# Patient Record
Sex: Female | Born: 1985 | Race: Asian | Hispanic: No | Marital: Married | Smoking: Never smoker
Health system: Southern US, Community
[De-identification: ages and names within clinical notes are randomized; demographics above are authoritative.]

## PROBLEM LIST (undated history)

## (undated) DIAGNOSIS — A879 Viral meningitis, unspecified: Secondary | ICD-10-CM

## (undated) DIAGNOSIS — D649 Anemia, unspecified: Secondary | ICD-10-CM

## (undated) DIAGNOSIS — Z87440 Personal history of urinary (tract) infections: Secondary | ICD-10-CM

## (undated) HISTORY — PX: WISDOM TOOTH EXTRACTION: SHX21

## (undated) HISTORY — PX: TOOTH EXTRACTION: SUR596

## (undated) HISTORY — DX: Personal history of urinary (tract) infections: Z87.440

---
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
## 2006-12-25 IMAGING — CR DG CHEST 2V
2 series · 2 of 2 positions shown · non-contrast
Comparison: none

CLINICAL DATA: Positive PPD.
 CHEST - 2 VIEW:
 The heart size and mediastinal contours are within normal limits.  Both lungs are clear.  The visualized skeletal structures are unremarkable.

[view not recorded (1 of 2)]
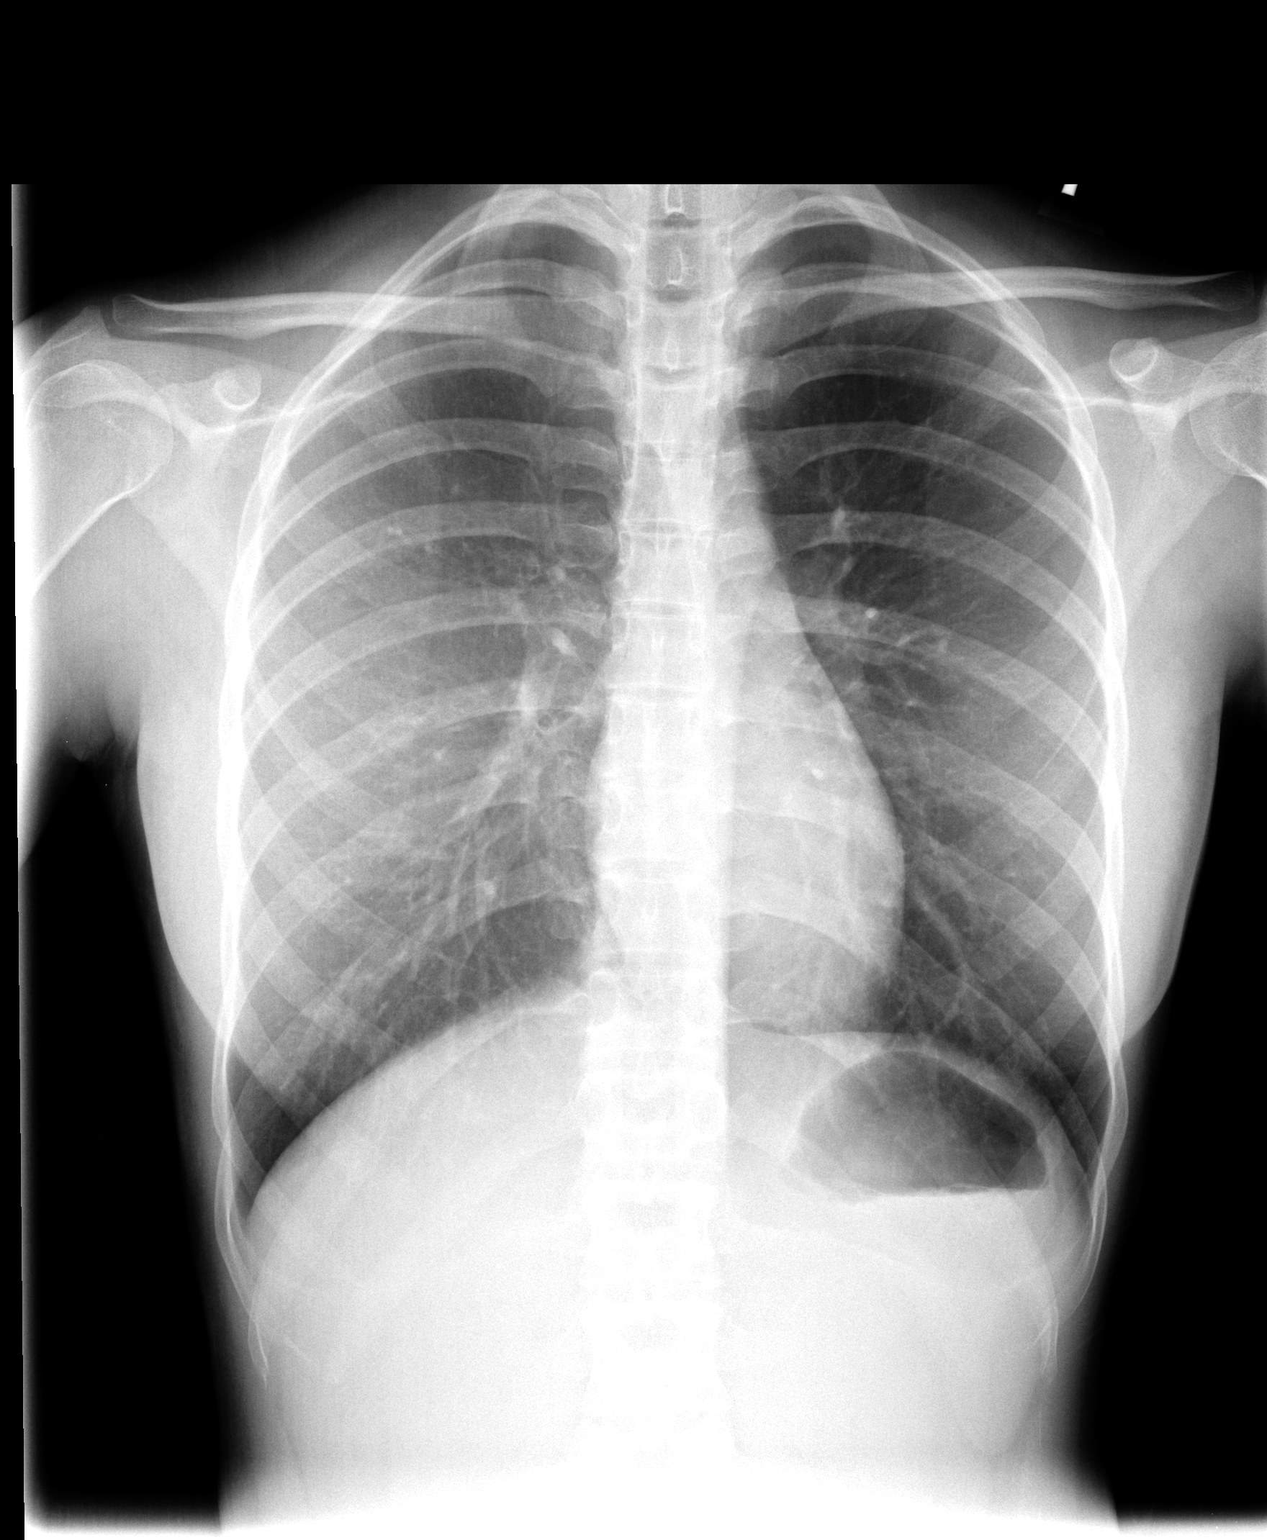

[view not recorded (2 of 2)]
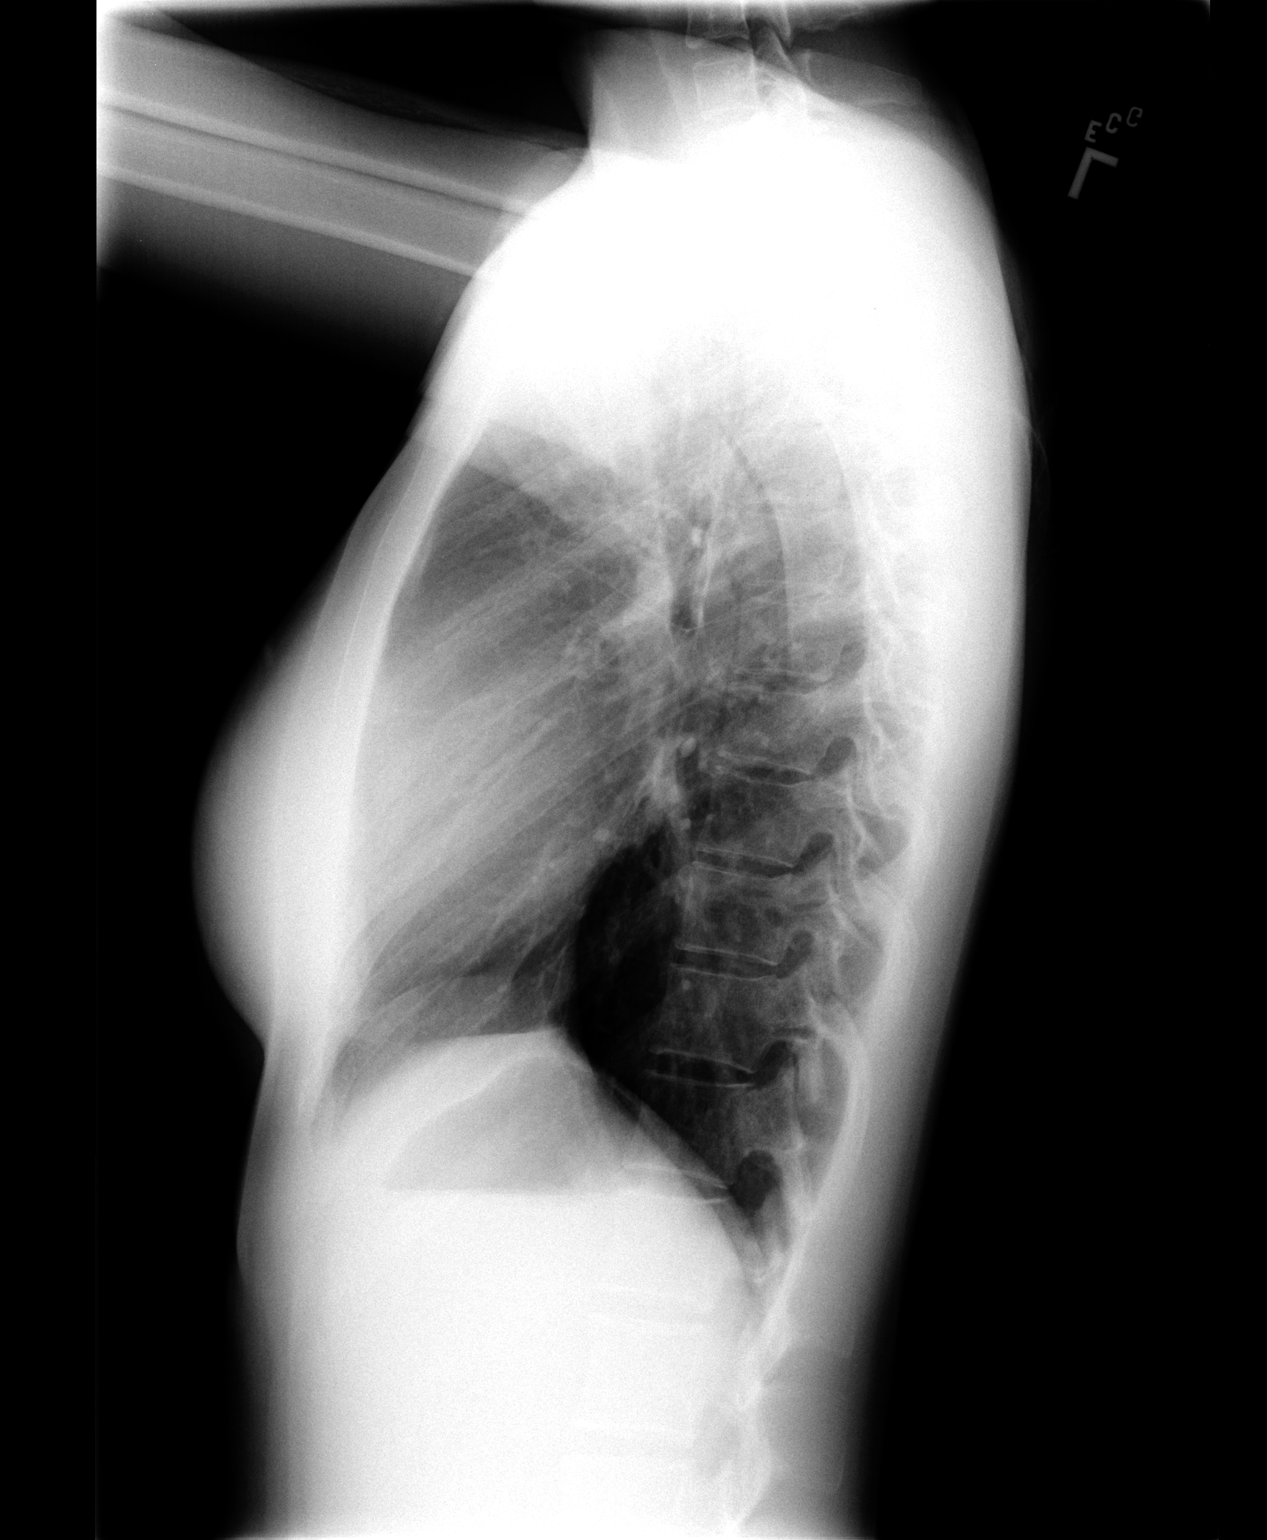

[2 of 2 positions shown; findings below may reference images not displayed]

IMPRESSION: No active cardiopulmonary disease.

## 2010-06-02 NOTE — L&D Delivery Note (Signed)
Delivery Note At 6:32 AM a viable and vigorous female named "[name]"  was delivered via Vaginal, Spontaneous Delivery (Presentation: Left Occiput Anterior, hand).  APGAR: 9, 9; weight 6 lb 13.2 oz (3096 g).  Placed to mother's abd. dryed and stimulated. Bulb suctioned for thin mec secretions. Cord clamped x 2 and cut per FOB. Placenta status: Intact, Spontaneous.  Cord: 3 vessels with the following complications: None.   Anesthesia: 1% Lidocaine for repair  Episiotomy: None Lacerations: 2nd degree;Vaginal;Perineal, 1st degree peri-urethral bilat Suture Repair: 3.0 chromic Est. Blood Loss (mL): 350  Mom to postpartum.  Baby to nursery-stable.  [name] 12/23/2010, 7:20 AM

## 2010-10-22 ENCOUNTER — Inpatient Hospital Stay (HOSPITAL_COMMUNITY)
Admission: AD | Admit: 2010-10-22 | Discharge: 2010-10-25 | DRG: 781 | Disposition: A | Discharge status: Home / Self Care | Payer: Medicaid Other | Source: Ambulatory Visit | Attending: Obstetrics and Gynecology | Admitting: Obstetrics and Gynecology

## 2010-10-22 DIAGNOSIS — B9689 Other specified bacterial agents as the cause of diseases classified elsewhere: Secondary | ICD-10-CM | POA: Diagnosis present

## 2010-10-22 DIAGNOSIS — O99019 Anemia complicating pregnancy, unspecified trimester: Secondary | ICD-10-CM | POA: Diagnosis present

## 2010-10-22 DIAGNOSIS — A499 Bacterial infection, unspecified: Secondary | ICD-10-CM | POA: Diagnosis present

## 2010-10-22 DIAGNOSIS — O2390 Unspecified genitourinary tract infection in pregnancy, unspecified trimester: Secondary | ICD-10-CM | POA: Diagnosis present

## 2010-10-22 DIAGNOSIS — D649 Anemia, unspecified: Secondary | ICD-10-CM | POA: Diagnosis present

## 2010-10-22 DIAGNOSIS — N390 Urinary tract infection, site not specified: Secondary | ICD-10-CM | POA: Diagnosis present

## 2010-10-22 DIAGNOSIS — O4690 Antepartum hemorrhage, unspecified, unspecified trimester: Principal | ICD-10-CM | POA: Diagnosis present

## 2010-10-22 DIAGNOSIS — O26879 Cervical shortening, unspecified trimester: Secondary | ICD-10-CM | POA: Diagnosis present

## 2010-10-22 DIAGNOSIS — O4700 False labor before 37 completed weeks of gestation, unspecified trimester: Secondary | ICD-10-CM | POA: Diagnosis present

## 2010-10-22 DIAGNOSIS — N760 Acute vaginitis: Secondary | ICD-10-CM | POA: Diagnosis present

## 2010-10-22 LAB — URINALYSIS, ROUTINE W REFLEX MICROSCOPIC
Bilirubin Urine: NEGATIVE
Ketones, ur: NEGATIVE mg/dL
Protein, ur: NEGATIVE mg/dL
Specific Gravity, Urine: <1.005 — ABNORMAL LOW (ref 1.005–1.030)

## 2010-10-22 LAB — URINE MICROSCOPIC-ADD ON

## 2010-10-23 ENCOUNTER — Observation Stay (HOSPITAL_COMMUNITY): Payer: Medicaid Other

## 2010-10-23 LAB — CBC
MCHC: 31.9 g/dL (ref 30.0–36.0)
MCV: 79.3 fL (ref 78.0–100.0)
Platelets: 189 10*3/uL (ref 150–400)

## 2010-10-24 ENCOUNTER — Other Ambulatory Visit (HOSPITAL_COMMUNITY): Payer: Medicaid Other

## 2010-10-24 ENCOUNTER — Inpatient Hospital Stay (HOSPITAL_COMMUNITY): Payer: Medicaid Other

## 2010-10-25 LAB — FETAL FIBRONECTIN: Fetal Fibronectin: NEGATIVE

## 2010-10-26 LAB — STREP B DNA PROBE: Strep Group B Ag: NEGATIVE

## 2010-10-26 LAB — URINE CULTURE
Culture  Setup Time: 201205250623
Special Requests: NEGATIVE

## 2010-11-11 NOTE — Discharge Summary (Signed)
[name], [name]                    ACCOUNT NO.:  [id number]  MEDICAL RECORD NO.:  [id number]           PATIENT TYPE:  I  LOCATION:  9156                          FACILITY:  [facility]  PHYSICIAN:  Janine Limbo, M.D.DATE OF BIRTH:  12-Apr-1986  DATE OF ADMISSION:  10/22/2010 DATE OF DISCHARGE:  10/25/2010                              DISCHARGE SUMMARY   ADMITTING DIAGNOSES: 1. 30.3 wk singleton intrauterine pregnancy with vaginal     bleeding. 2. Shortened cervix. 3. Third trimester bleeding. 4. Urinary tract infection.  DISCHARGE DIAGNOSES: 1. 30.3 wk singleton intrauterine pregnancy with vaginal     bleeding. 2. Shortened cervix. 3. Third trimester bleeding. 4. Urinary tract infection.  HOSPITAL PROCEDURES:  Ultrasound, Maternal Fetal Medicine consult. Urinalysis for culture and sensitivity.  GC and Chlamydia cultures.  HOSPITAL COURSE:  Courtney [name] presented to the MAU on 10/22/2010 at around 2145 with complaints of vaginal bleeding on her tissue after wiping and in the urine.  She was placed on EFM, assessed, and found to have uterine contractions approximately every 2-5 minutes that palpated moderate on exam.  On Steriele speculum exam, her cervix was friable and had a small amount of white/yellowish discharge.  Her cervix was closed, thick and high.  Her urinalysis was significant for moderate hemoglobin, moderate leukocytes, 3-6 wbc's and few bacteria.  She was noted to have been treated with Macrobid for a urinary tract infection about a month ago. She was given an IV fluid LR bolus, Procardia 10 mg by mouth, and continued monitoring.    By 09:45  the next morning, she was doing well.  She had received terbutaline twice through the night, which had decreased her uterine contractions to only occasional.  She was placed on Procardia 10 mg p.o. q.6 h. and sceduled to have a complete OB ultrasound ASAP and a repeat CBC. The patient was updated on the plan of care at that time.     Around 11:15 that morning, she had had her scheduled Procardia dose making her feel tired, but was helping with the contractions.  She was having no vaginal bleeding or loss of fluids.  Her vital signs were stable.  Fetal heart rate was reactive at 140s.  She had an occasional moderate to deep variable and contractions were noted about every 4-7 minutes with some uterine irritability.  She had an ultrasound that showed some cervical shortening, had a Maternal Fetal Medicine consult and had a cervical exam by Dr. Su Hilt which was noted to be closed, 80%, 0 station, but very soft.  The group B strep culture was done at that time and plans for a fetal fibronectin the next day after 24 hours, and if that was negative and she had no cervical change, that she could probably be discharged home on Procardia, which were the recommendations made by MFM.  On May 25,2012, she had rested during the night comfortably.  She was having minimal contractions on Procardia 10 mg p.o. q.6 h.  She was afebrile and her her vital signs were stable.  Fetal heart rate was 135, category I, reactive  NST, no decels, having about 1-4 contractions per hour and she was sleeping through.  Plans for a fetal fibronectin were made for 1320 that day and a vaginal exam afterwards.  At 1400, she was still having a reactive NST with an occasional mild spontaneous variable. A Sterile speculum exam was done for obtaining a fetal fibronectin.  The cervix was visually closed with no active bleeding or pooling and the SVE was deferred. Approximately 2 hours later, her fetal fibronectin results did return negative.  Dr. Pennie Rushing saw her, reviewed the plan of care, did a SVE and found her cervix to be closed at 50%, vertex, and not unusually soft for effacement.  With MFM recommendation and since her fetal fibronectin was negative with no cervical change  she was deemed stable to be discharged home.   Courtney [name] has pending group B strep  and urine cultures.  She has had 2 doses status post betamethasone She was deemed stable enough for discharge on bedrest to complete a 7- day course of Flagyl, 7-day course of Macrobid, and to keep a followup appointment on November 06, 2010.  Procardia was p.r.n., greater than 6 contractions per hour.  She is to follow up at [facility] on the 1st of June or if she experiences any problems prior to that day.    ______________________________ [name], [facility], CNM   ______________________________ Janine Limbo, M.D.    DT/MEDQ  D:  10/26/2010  T:  10/27/2010  Job:  [id number]  Electronically Signed by [name] CNM on 10/31/2010 04:36:01 PM Electronically Signed by Kirkland Hun M.D. on 11/11/2010 03:47:44 PM

## 2010-11-25 ENCOUNTER — Inpatient Hospital Stay (HOSPITAL_COMMUNITY)
Admission: AD | Admit: 2010-11-25 | Discharge: 2010-11-25 | Disposition: A | Discharge status: Home / Self Care | Payer: Medicaid Other | Source: Ambulatory Visit | Attending: Obstetrics and Gynecology | Admitting: Obstetrics and Gynecology

## 2010-11-25 DIAGNOSIS — O4700 False labor before 37 completed weeks of gestation, unspecified trimester: Secondary | ICD-10-CM | POA: Insufficient documentation

## 2010-11-25 LAB — URINALYSIS, ROUTINE W REFLEX MICROSCOPIC
Glucose, UA: NEGATIVE mg/dL
Hgb urine dipstick: NEGATIVE
Nitrite: NEGATIVE
Specific Gravity, Urine: 1.02 (ref 1.005–1.030)
Urobilinogen, UA: 0.2 mg/dL (ref 0.0–1.0)
pH: 7.5 (ref 5.0–8.0)

## 2010-11-25 LAB — URINE MICROSCOPIC-ADD ON

## 2010-12-22 ENCOUNTER — Inpatient Hospital Stay (HOSPITAL_COMMUNITY)
Admission: AD | Admit: 2010-12-22 | Discharge: 2010-12-23 | Disposition: A | Discharge status: Home / Self Care | Payer: Medicaid Other | Source: Ambulatory Visit | Attending: Obstetrics and Gynecology | Admitting: Obstetrics and Gynecology

## 2010-12-22 ENCOUNTER — Encounter (HOSPITAL_COMMUNITY): Payer: Self-pay | Admitting: *Deleted

## 2010-12-22 DIAGNOSIS — O99891 Other specified diseases and conditions complicating pregnancy: Secondary | ICD-10-CM | POA: Insufficient documentation

## 2010-12-22 DIAGNOSIS — O479 False labor, unspecified: Secondary | ICD-10-CM

## 2010-12-22 DIAGNOSIS — N898 Other specified noninflammatory disorders of vagina: Secondary | ICD-10-CM | POA: Insufficient documentation

## 2010-12-22 HISTORY — DX: Viral meningitis, unspecified: A87.9

## 2010-12-22 HISTORY — DX: Anemia, unspecified: D64.9

## 2010-12-22 NOTE — Progress Notes (Signed)
Pt states she had mucus dc with blood tinge.  Ctx since 1430.  G1, 39wks

## 2010-12-22 NOTE — Progress Notes (Signed)
Pt says her water broke @ 2218

## 2010-12-23 ENCOUNTER — Inpatient Hospital Stay (HOSPITAL_COMMUNITY)
Admission: AD | Admit: 2010-12-23 | Discharge: 2010-12-25 | DRG: 775 | Disposition: A | Discharge status: Home / Self Care | Payer: Medicaid Other | Source: Ambulatory Visit | Attending: Obstetrics and Gynecology | Admitting: Obstetrics and Gynecology

## 2010-12-23 ENCOUNTER — Encounter (HOSPITAL_COMMUNITY): Payer: Self-pay | Admitting: *Deleted

## 2010-12-23 DIAGNOSIS — Z331 Pregnant state, incidental: Secondary | ICD-10-CM

## 2010-12-23 MED ORDER — LIDOCAINE HCL (PF) 1 % IJ SOLN: 30.0000 mL | INTRAMUSCULAR | Status: DC | PRN

## 2010-12-23 MED ORDER — LACTATED RINGERS IV SOLN: INTRAVENOUS | Status: DC

## 2010-12-23 MED ORDER — CITRIC ACID-SODIUM CITRATE 334-500 MG/5ML PO SOLN: 30.0000 mL | ORAL | Status: DC | PRN

## 2010-12-23 MED ORDER — OXYTOCIN 10 UNIT/ML IJ SOLN
INTRAMUSCULAR | Status: AC
  Administered 2010-12-23: 20 [IU] via INTRAMUSCULAR
  Filled 2010-12-23: qty 2

## 2010-12-23 MED ORDER — OXYCODONE-ACETAMINOPHEN 5-325 MG PO TABS: 2.0000 | ORAL_TABLET | ORAL | Status: DC | PRN

## 2010-12-23 MED ORDER — LIDOCAINE HCL (PF) 1 % IJ SOLN
INTRAMUSCULAR | Status: AC
  Administered 2010-12-23: 30 mL
  Filled 2010-12-23: qty 30

## 2010-12-23 MED ORDER — ACETAMINOPHEN 325 MG PO TABS: 650.0000 mg | ORAL_TABLET | ORAL | Status: DC | PRN

## 2010-12-23 MED ORDER — IBUPROFEN 600 MG PO TABS
600.0000 mg | ORAL_TABLET | Freq: Four times a day (QID) | ORAL | Status: DC
  Administered 2010-12-23 – 2010-12-25 (×9): 600 mg via ORAL
  Filled 2010-12-23 (×9): qty 1

## 2010-12-23 MED ORDER — ONDANSETRON HCL 4 MG/2ML IJ SOLN: 4.0000 mg | Freq: Four times a day (QID) | INTRAMUSCULAR | Status: DC | PRN

## 2010-12-23 MED ORDER — FLEET ENEMA 7-19 GM/118ML RE ENEM: 1.0000 | ENEMA | RECTAL | Status: DC | PRN

## 2010-12-23 MED ORDER — IBUPROFEN 600 MG PO TABS: 600.0000 mg | ORAL_TABLET | Freq: Four times a day (QID) | ORAL | Status: DC | PRN

## 2010-12-23 NOTE — H&P (Signed)
Courtney [name] is a 25 y.o. female presenting for Active Labor with urge to push once arriving onto Labor and Delivery. Maternal Medical History:  Reason for admission: Reason for admission: contractions.  Contractions: Onset was 1-2 hours ago.   Frequency: regular.   Perceived severity is strong.    Fetal activity: Perceived fetal activity is normal.   Last perceived fetal movement was within the past hour.    Prenatal complications: no prenatal complications No bleeding, cholelithiasis, HIV, hypertension, infection, IUGR, nephrolithiasis, oligohydramnios, placental abnormality, polyhydramnios, pre-eclampsia, preterm labor, substance abuse, thrombocytopenia or thrombophilia.     OB History    Grav Para Term Preterm Abortions TAB SAB Ect Mult Living   1 1 1       1      Past Medical History  Diagnosis Date  . Viral meningitis   . Anemia    Past Surgical History  Procedure Date  . Tooth extraction   . Wisdom tooth extraction    Family History: family history includes Thyroid cancer in her mother. Social History:  reports that she has never smoked. She does not have any smokeless tobacco history on file. She reports that she does not drink alcohol or use illicit drugs.  Review of Systems  Constitutional: Negative.   HENT: Negative.   Eyes: Negative.   Respiratory: Negative.   Cardiovascular: Negative.   Gastrointestinal: Negative.   Genitourinary: Negative.   Musculoskeletal: Negative.   Skin: Negative.   Neurological: Negative.   Endo/Heme/Allergies: Negative.   Psychiatric/Behavioral: Negative.     Dilation: 10 Effacement (%): 100 Station: +2 Exam by:: D.Vicie Cech, CNM  Blood pressure 105/68, pulse 67, temperature 98.6 F (37 C), temperature source Oral, resp. rate 16, height 5\' 2"  (1.575 m), weight 53.978 kg (119 lb), SpO2 97.00%, unknown if currently breastfeeding. Maternal Exam:  Uterine Assessment: Contraction strength is firm.  Contraction frequency is regular.    Abdomen: Patient reports no abdominal tenderness. Fundal height is aga.   Fetal presentation: vertex  Introitus: Normal vulva. Normal vagina.  Pelvis: adequate for delivery.   Cervix: Cervix evaluated by digital exam.     Fetal Exam Fetal Monitor Review: Mode: ultrasound.   Baseline rate: 120.  Variability: moderate (6-25 bpm).   Pattern: accelerations present and variable decelerations.    Fetal State Assessment: Category II - tracings are indeterminate.     Physical Exam  Constitutional: She is oriented to person, place, and time. She appears well-developed and well-nourished. She appears distressed (with UC's).  Cardiovascular: Normal rate.   Respiratory: Effort normal.  GI: Soft.  Genitourinary: Vagina normal and uterus normal.  Musculoskeletal: Normal range of motion.  Neurological: She is alert and oriented to person, place, and time.  Skin: Skin is warm and dry.  Psychiatric: She has a normal mood and affect. Her behavior is normal. Judgment and thought content normal.   SVE C/C/+1 st with BBOW, Arom lt Mec stained AF, prepared for del.  Prenatal labs: ABO, Rh:   Antibody:   Rubella:   RPR:    HBsAg:    HIV:    GBS: NEGATIVE (05/24 1304)   Assessment/Plan: 1> Term SIUP Active Labor @ 10 cms and urge to push Plan: Admit Anticipate SVD Dr. Stefano Gaul made aware after delivery completed   [name] 12/23/2010, 1:18 PM

## 2010-12-23 NOTE — ED Provider Notes (Signed)
Courtney [name] is a 25 y.o. female presenting for evaluation of SROM and UC's.  States had "gush of fluid @ home, but none since" was yellowish in color and slight blood present. Also UC's but not progressively worse than has had all week. Was evaluated in office on WEd and was 3 cms.  History OB History    Grav Para Term Preterm Abortions TAB SAB Ect Mult Living   1              Past Medical History  Diagnosis Date  . Viral meningitis   . Anemia    Past Surgical History  Procedure Date  . Tooth extraction    Family History: family history includes Thyroid cancer in her mother. Social History:  reports that she has never smoked. She does not have any smokeless tobacco history on file. She reports that she does not drink alcohol or use illicit drugs.  Review of Systems  Constitutional: Negative.   HENT: Negative.   Eyes: Negative.   Respiratory: Negative.   Cardiovascular: Negative.   Gastrointestinal: Negative.   Genitourinary: Negative.   Musculoskeletal: Negative.   Skin: Negative.   Endo/Heme/Allergies: Negative.   Psychiatric/Behavioral: Negative.     Dilation: 3.5 Effacement (%): 90 Station: -1 Exam by:: D. Ladona Ridgel CNM Blood pressure 120/84, pulse 85, temperature 98.5 F (36.9 C), temperature source Oral, resp. rate 18, height 5\' 2"  (1.575 m), weight 54.159 kg (119 lb 6.4 oz). Maternal Exam:  Uterine Assessment: Contraction strength is mild.  Contraction frequency is irregular.   Abdomen: Patient reports no abdominal tenderness. Fundal height is AGA.   Fetal presentation: vertex  Introitus: Normal vulva. Vagina is positive for vaginal discharge (mucousy with scant blood).  Ferning test: negative.   Pelvis: adequate for delivery.   Cervix: Cervix evaluated by sterile speculum exam and digital exam.     Fetal Exam Fetal Monitor Review: Mode: ultrasound.   Baseline rate: 135.  Variability: moderate (6-25 bpm).   Pattern: accelerations present and no decelerations.      Fetal State Assessment: Category I - tracings are normal.     Physical Exam  Constitutional: She is oriented to person, place, and time. She appears well-developed and well-nourished.  HENT:  Head: Normocephalic.  Respiratory: Effort normal.  Genitourinary: Vaginal discharge (mucousy with scant blood) found.  Musculoskeletal: Normal range of motion. She exhibits no edema.  Neurological: She is alert and oriented to person, place, and time.  Skin: Skin is warm and dry.  Psychiatric: She has a normal mood and affect. Her behavior is normal. Judgment and thought content normal.   Amnisure neg SVE 3-4/90/-1 vtx with palpable BOW, no cervical change after 1.5 hours (1 hour of ambulation) Prenatal labs: ABO, Rh:   Antibody:   Rubella:   RPR:    HBsAg:    HIV:    GBS: NEGATIVE (05/24 1304)   Assessment/Plan: 1. R/O ROM 2. False Labor Plan: Reviewed pt status with Dr. Stefano Gaul 1. Discharge home with labor warnings 2. Keep appt. In office for Wednesday   [name] 12/23/2010, 12:43 AM

## 2010-12-23 NOTE — Plan of Care (Signed)
Problem: Consults Goal: Birthing Suites Patient Information Press F2 to bring up selections list  Outcome: Completed/Met Date Met:  12/23/10  Pt 37-[redacted] weeks EGA

## 2010-12-23 NOTE — Progress Notes (Signed)
Pt returned from ambulating

## 2010-12-24 LAB — CBC
Hemoglobin: 10.4 g/dL — ABNORMAL LOW (ref 12.0–15.0)
MCH: 26 pg (ref 26.0–34.0)
MCV: 78.3 fL (ref 78.0–100.0)
Platelets: 174 10*3/uL (ref 150–400)
RBC: 4 MIL/uL (ref 3.87–5.11)
WBC: 13.6 10*3/uL — ABNORMAL HIGH (ref 4.0–10.5)

## 2010-12-24 MED ORDER — SIMETHICONE 80 MG PO CHEW: 80.0000 mg | CHEWABLE_TABLET | ORAL | Status: DC | PRN

## 2010-12-24 MED ORDER — WITCH HAZEL-GLYCERIN EX PADS: MEDICATED_PAD | CUTANEOUS | Status: DC | PRN

## 2010-12-24 MED ORDER — DIPHENHYDRAMINE HCL 25 MG PO CAPS: 25.0000 mg | ORAL_CAPSULE | Freq: Four times a day (QID) | ORAL | Status: DC | PRN

## 2010-12-24 MED ORDER — OXYCODONE-ACETAMINOPHEN 5-325 MG PO TABS: 1.0000 | ORAL_TABLET | ORAL | Status: DC | PRN

## 2010-12-24 MED ORDER — TETANUS-DIPHTH-ACELL PERTUSSIS 5-2.5-18.5 LF-MCG/0.5 IM SUSP: 0.5000 mL | Freq: Once | INTRAMUSCULAR | Status: DC

## 2010-12-24 MED ORDER — ZOLPIDEM TARTRATE 5 MG PO TABS: 5.0000 mg | ORAL_TABLET | Freq: Every evening | ORAL | Status: DC | PRN

## 2010-12-24 MED ORDER — BENZOCAINE-MENTHOL 20-0.5 % EX AERO
INHALATION_SPRAY | CUTANEOUS | Status: AC
  Administered 2010-12-24: 1 via TOPICAL
  Filled 2010-12-24: qty 56

## 2010-12-24 MED ORDER — SENNOSIDES-DOCUSATE SODIUM 8.6-50 MG PO TABS
1.0000 | ORAL_TABLET | Freq: Every day | ORAL | Status: DC
  Administered 2010-12-25: 1 via ORAL

## 2010-12-24 MED ORDER — BENZOCAINE-MENTHOL 20-0.5 % EX AERO
1.0000 "application " | INHALATION_SPRAY | CUTANEOUS | Status: DC | PRN
  Administered 2010-12-24: 1 via TOPICAL

## 2010-12-24 MED ORDER — PRENATAL PLUS 27-1 MG PO TABS
1.0000 | ORAL_TABLET | Freq: Every day | ORAL | Status: DC
  Administered 2010-12-24 – 2010-12-25 (×2): 1 via ORAL
  Filled 2010-12-24 (×2): qty 1

## 2010-12-24 MED ORDER — ONDANSETRON HCL 4 MG PO TABS: 4.0000 mg | ORAL_TABLET | ORAL | Status: DC | PRN

## 2010-12-24 MED ORDER — LANOLIN HYDROUS EX OINT: TOPICAL_OINTMENT | CUTANEOUS | Status: DC | PRN

## 2010-12-24 MED ORDER — ONDANSETRON HCL 4 MG/2ML IJ SOLN: 4.0000 mg | INTRAMUSCULAR | Status: DC | PRN

## 2010-12-24 NOTE — Progress Notes (Signed)
Post Partum Day 1 Subjective: up ad lib, voiding, tolerating PO, + flatus and very sleepy.  BF'ng going well.  VB lightening.  s.o. at bs & supportive  Objective: Blood pressure 102/68, pulse 75, temperature 98.1 F (36.7 C), temperature source Oral, resp. rate 18, height 5\' 2"  (1.575 m), weight 53.978 kg (119 lb), SpO2 97.00%, unknown if currently breastfeeding.  Physical Exam:  General: alert, cooperative, no distress and drowsy Lochia: appropriate Uterine Fundus: firm; u/-1 Incision: n/a DVT Evaluation: No evidence of DVT seen on physical exam. Negative Homan's sign. No significant calf/ankle edema.  No results found for this basename: HGB:2,HCT:2 in the last 72 hours  Assessment/Plan: Plan for discharge tomorrow, Breastfeeding and Lactation consult.  CBC ordered.   LOS: 1 day   Halina Asano H 12/24/2010, 9:16 AM

## 2010-12-24 NOTE — Progress Notes (Signed)
UR Chart review completed.  

## 2010-12-24 NOTE — Progress Notes (Signed)
Infant cluster fed in night and mom states about 6am infant began latching well and could hear swallowing. Reviewed lots about pumping , mom states may go back to school , insterested in pumping and bottle feeding later.

## 2010-12-24 NOTE — Progress Notes (Signed)
Basic teaching done with parents, Infant latched well and 20 min feeding observed. Answered lots of questions about basics.

## 2010-12-25 DIAGNOSIS — Z331 Pregnant state, incidental: Secondary | ICD-10-CM

## 2010-12-25 MED ORDER — OXYCODONE-ACETAMINOPHEN 5-325 MG PO TABS: 1.0000 | ORAL_TABLET | Freq: Four times a day (QID) | ORAL | Status: AC | PRN

## 2010-12-25 MED ORDER — IBUPROFEN 600 MG PO TABS: 600.0000 mg | ORAL_TABLET | Freq: Four times a day (QID) | ORAL | Status: AC

## 2010-12-25 NOTE — Discharge Summary (Signed)
Obstetric Discharge Summary Reason for Admission: onset of labor Prenatal Procedures: ultrasound Intrapartum Procedures: spontaneous vaginal delivery Postpartum Procedures: none Complications-Operative and Postpartum: 2nd degree perineal laceration  Hemoglobin  Date Value Range Status  12/24/2010 10.4* 12.0-15.0 (g/dL) Final     HCT  Date Value Range Status  12/24/2010 31.3* 36.0-46.0 (%) Final    Discharge Diagnoses: Term Pregnancy-delivered  Discharge Information: Date: 12/25/2010 Activity: unrestricted Diet: routine Medications: Ibuprophen and Percocet Condition: stable Instructions: refer to practice specific booklet Discharge to: home Follow-up Information    Follow up with [name], CNM. Make an appointment in 4 weeks. (Call for an appt for 4 weeks.)    Contact information:   [address] [facility] [place] [address] [phone]          Newborn Data: Live born  Information for the patient's newborn:  Ida, Girl Imani [[id number]]  female ; APGAR APGAR (1 MIN): 9   APGAR (5 MINS): 9   APGAR (10 MINS):   , ; weight ; 6# 13.2 Home with mother.  Adeola Dennen O. 12/25/2010, 11:09 AM

## 2010-12-25 NOTE — Progress Notes (Signed)
Post Partum Day 2 Subjective: no complaints, up ad lib, voiding, tolerating PO and + flatus.  Working on breastfeeding.  Minimal perineal pain and well controlled with medications  Objective: Blood pressure 108/61, pulse 90, temperature 98 F (36.7 C), temperature source Oral, resp. rate 18, height 5\' 2"  (1.575 m), weight 53.978 kg (119 lb), SpO2 97.00%, unknown if currently breastfeeding. Filed Vitals:   12/24/10 0536 12/24/10 1350 12/24/10 2149 12/25/10 0514  BP: 102/68 115/69 96/62 108/61  Pulse: 75 74 80 90  Temp: 98.1 F (36.7 C) 98.4 F (36.9 C) 98.2 F (36.8 C) 98 F (36.7 C)  TempSrc: Oral Oral Oral Oral  Resp: 18 18 18 18   Height:      Weight:      SpO2:       Physical Exam:  General: alert, cooperative and no distress. Skin  W&D, color satis.   Heart:  RRR Lungs:  Clear to ausculation bilat Abd:  Soft/Nontender.  Pos BS x 4 quads Lochia: appropriate Uterine Fundus: firm, nontender 2 below umbilicus  Incision: N/A Perineum:  Healing/Intact DVT Evaluation: No evidence of DVT seen on physical exam.  Neg Homan's bilat. Extrems:  Neg edema.  DTRs 1+, no clonus.   Basename 12/24/10 1015  HGB 10.4*  HCT 31.3*    Assessment/Plan: Stable s/p vag delivery at 39w 2d.  Discharge to home.  D/C instructions reviewed.  Pt plans Mirena for contraception.     LOS: 2 days   Joda Braatz O. 12/25/2010, 11:00 AM

## 2010-12-30 NOTE — H&P (Signed)
Reviewed

## 2011-06-06 LAB — ABO/RH: ABO/RH(D): O POS

## 2011-12-19 ENCOUNTER — Encounter: Payer: Self-pay | Admitting: Obstetrics and Gynecology

## 2012-02-11 ENCOUNTER — Encounter: Payer: Self-pay | Admitting: Obstetrics and Gynecology

## 2012-02-11 ENCOUNTER — Ambulatory Visit (INDEPENDENT_AMBULATORY_CARE_PROVIDER_SITE_OTHER): Payer: BC Managed Care – PPO | Admitting: Obstetrics and Gynecology

## 2012-02-11 VITALS — BP 100/60 | HR 68 | Resp 16 | Ht 62.0 in | Wt 93.0 lb

## 2012-02-11 DIAGNOSIS — IMO0002 Reserved for concepts with insufficient information to code with codable children: Secondary | ICD-10-CM

## 2012-02-11 DIAGNOSIS — Z0000 Encounter for general adult medical examination without abnormal findings: Secondary | ICD-10-CM

## 2012-02-11 DIAGNOSIS — Z124 Encounter for screening for malignant neoplasm of cervix: Secondary | ICD-10-CM

## 2012-02-11 NOTE — Progress Notes (Signed)
Contraception None Last pap 08/07/2010 WNL Last Mammo None Last Colonoscopy None Last Dexa Scan None Primary MD None Abuse at Home None  No complaints.  Wants Mirena (SE Reviewed and discussed). Filed Vitals:   02/11/12 1514  BP: 100/60  Pulse: 68  Resp: 16   ROS: noncontributory  Physical Examination: General appearance - alert, well appearing, and in no distress Neck - supple, no significant adenopathy Chest - clear to auscultation, no wheezes, rales or rhonchi, symmetric air entry Heart - normal rate and regular rhythm Abdomen - soft, nontender, nondistended, no masses or organomegaly Breasts - breasts appear normal, no suspicious masses, no skin or nipple changes or axillary nodes Pelvic - normal external genitalia, vulva, vagina, cervix, uterus and adnexa Back exam - no CVAT Extremities - no edema, redness or tenderness in the calves or thighs  A/P With menses for Mirena GC/CT today with consent pre IUD AEX in 72yr

## 2012-02-13 LAB — PAP IG, CT-NG, RFX HPV ASCU

## 2012-02-23 ENCOUNTER — Encounter: Payer: Self-pay | Admitting: Obstetrics and Gynecology

## 2012-02-23 ENCOUNTER — Ambulatory Visit (INDEPENDENT_AMBULATORY_CARE_PROVIDER_SITE_OTHER): Payer: BC Managed Care – PPO | Admitting: Obstetrics and Gynecology

## 2012-02-23 VITALS — BP 100/60 | Ht 63.0 in | Wt 95.0 lb

## 2012-02-23 DIAGNOSIS — Z139 Encounter for screening, unspecified: Secondary | ICD-10-CM

## 2012-02-23 DIAGNOSIS — Z975 Presence of (intrauterine) contraceptive device: Secondary | ICD-10-CM

## 2012-02-23 DIAGNOSIS — Z30430 Encounter for insertion of intrauterine contraceptive device: Secondary | ICD-10-CM

## 2012-02-23 LAB — POCT URINE PREGNANCY: Preg Test, Ur: NEGATIVE

## 2012-02-23 MED ORDER — LEVONORGESTREL 20 MCG/24HR IU IUD
INTRAUTERINE_SYSTEM | Freq: Once | INTRAUTERINE | Status: AC
  Administered 2012-02-23: 1 via INTRAUTERINE

## 2012-02-23 NOTE — Progress Notes (Signed)
ZOX:WRUEAV LOT#: [id number] Exp: 06/2014 UPT: negative GC/CHLAMYDIA: negative CONSENT SIGNED: yes DISINFECTION WITH betadine X3 UTERUS SOUNDED AT 7 CM IUD INSERTED PER PROTOCOL: yes COMPLICATION: none PATIENT INSTRUCTED TO CALL IS FEVER OR ABNORMAL PAIN:yes PATIENT INSTRUCTED ON HOW TO CHECK IUD STRINGS: yes FOLLOW UP APPT: 5wks

## 2012-03-31 ENCOUNTER — Encounter: Payer: Self-pay | Admitting: Obstetrics and Gynecology

## 2012-03-31 ENCOUNTER — Ambulatory Visit (INDEPENDENT_AMBULATORY_CARE_PROVIDER_SITE_OTHER): Payer: BC Managed Care – PPO | Admitting: Obstetrics and Gynecology

## 2012-03-31 VITALS — BP 90/60 | Ht 62.0 in | Wt 92.0 lb

## 2012-03-31 DIAGNOSIS — Z30431 Encounter for routine checking of intrauterine contraceptive device: Secondary | ICD-10-CM

## 2012-03-31 NOTE — Progress Notes (Signed)
Here to f/u IUD insertion No complaints  Filed Vitals:   03/31/12 1518  BP: 90/60   ROS: noncontributory  Pelvic exam:  VULVA: normal appearing vulva with no masses, tenderness or lesions,  VAGINA: normal appearing vagina with normal color and discharge, no lesions, CERVIX: normal appearing cervix without discharge or lesions, string is visible UTERUS: uterus is normal size, shape, consistency and nontender,  ADNEXA: normal adnexa in size, nontender and no masses.  A/P AEX in September IUD in place

## 2013-04-29 ENCOUNTER — Encounter (HOSPITAL_COMMUNITY): Payer: Self-pay | Admitting: Emergency Medicine

## 2013-04-29 ENCOUNTER — Emergency Department (HOSPITAL_COMMUNITY)
Admission: EM | Admit: 2013-04-29 | Discharge: 2013-04-29 | Disposition: A | Discharge status: Home / Self Care | Payer: Self-pay | Attending: Emergency Medicine | Admitting: Emergency Medicine

## 2013-04-29 DIAGNOSIS — N12 Tubulo-interstitial nephritis, not specified as acute or chronic: Secondary | ICD-10-CM | POA: Insufficient documentation

## 2013-04-29 DIAGNOSIS — Z8661 Personal history of infections of the central nervous system: Secondary | ICD-10-CM | POA: Insufficient documentation

## 2013-04-29 DIAGNOSIS — Z87440 Personal history of urinary (tract) infections: Secondary | ICD-10-CM | POA: Insufficient documentation

## 2013-04-29 DIAGNOSIS — Z3202 Encounter for pregnancy test, result negative: Secondary | ICD-10-CM | POA: Insufficient documentation

## 2013-04-29 DIAGNOSIS — D649 Anemia, unspecified: Secondary | ICD-10-CM | POA: Insufficient documentation

## 2013-04-29 LAB — COMPREHENSIVE METABOLIC PANEL
Albumin: 4.5 g/dL (ref 3.5–5.2)
Alkaline Phosphatase: 44 U/L (ref 39–117)
BUN: 14 mg/dL (ref 6–23)
Calcium: 9.4 mg/dL (ref 8.4–10.5)
Creatinine, Ser: 0.7 mg/dL (ref 0.50–1.10)
GFR calc Af Amer: >90 mL/min (ref >90)
Glucose, Bld: 95 mg/dL (ref 70–99)
Total Protein: 7.8 g/dL (ref 6.0–8.3)

## 2013-04-29 LAB — URINALYSIS, ROUTINE W REFLEX MICROSCOPIC
Glucose, UA: NEGATIVE mg/dL
Ketones, ur: 15 mg/dL — AB
Nitrite: POSITIVE — AB
Specific Gravity, Urine: 1.016 (ref 1.005–1.030)
pH: 6 (ref 5.0–8.0)

## 2013-04-29 LAB — CBC WITH DIFFERENTIAL/PLATELET
Basophils Absolute: 0 10*3/uL (ref 0.0–0.1)
Basophils Relative: 0 % (ref 0–1)
Eosinophils Absolute: 0.2 10*3/uL (ref 0.0–0.7)
HCT: 35.8 % — ABNORMAL LOW (ref 36.0–46.0)
Hemoglobin: 12.1 g/dL (ref 12.0–15.0)
Lymphocytes Relative: 21 % (ref 12–46)
Lymphs Abs: 1.8 10*3/uL (ref 0.7–4.0)
MCH: 25.2 pg — ABNORMAL LOW (ref 26.0–34.0)
MCHC: 33.8 g/dL (ref 30.0–36.0)
Monocytes Absolute: 0.6 10*3/uL (ref 0.1–1.0)
Neutro Abs: 6.2 10*3/uL (ref 1.7–7.7)
RDW: 12.7 % (ref 11.5–15.5)

## 2013-04-29 LAB — URINE MICROSCOPIC-ADD ON

## 2013-04-29 LAB — LIPASE, BLOOD: Lipase: 16 U/L (ref 11–59)

## 2013-04-29 MED ORDER — CIPROFLOXACIN HCL 500 MG PO TABS: 500.0000 mg | ORAL_TABLET | Freq: Two times a day (BID) | ORAL | Status: DC

## 2013-04-29 MED ORDER — MORPHINE SULFATE 4 MG/ML IJ SOLN
4.0000 mg | Freq: Once | INTRAMUSCULAR | Status: AC
  Administered 2013-04-29: 4 mg via INTRAVENOUS
  Filled 2013-04-29: qty 1

## 2013-04-29 MED ORDER — HYDROCODONE-ACETAMINOPHEN 5-325 MG PO TABS: 1.0000 | ORAL_TABLET | ORAL | Status: DC | PRN

## 2013-04-29 MED ORDER — ONDANSETRON HCL 4 MG/2ML IJ SOLN
4.0000 mg | Freq: Once | INTRAMUSCULAR | Status: AC
  Administered 2013-04-29: 4 mg via INTRAVENOUS
  Filled 2013-04-29: qty 2

## 2013-04-29 MED ORDER — ONDANSETRON HCL 4 MG PO TABS: 4.0000 mg | ORAL_TABLET | Freq: Four times a day (QID) | ORAL | Status: DC

## 2013-04-29 MED ORDER — SODIUM CHLORIDE 0.9 % IV BOLUS (SEPSIS)
1000.0000 mL | Freq: Once | INTRAVENOUS | Status: AC
  Administered 2013-04-29: 1000 mL via INTRAVENOUS

## 2013-04-29 MED ORDER — DEXTROSE 5 % IV SOLN
1.0000 g | Freq: Once | INTRAVENOUS | Status: AC
  Administered 2013-04-29: 1 g via INTRAVENOUS
  Filled 2013-04-29: qty 10

## 2013-04-29 NOTE — ED Provider Notes (Signed)
Medical screening examination/treatment/procedure(s) were performed by non-physician practitioner and as supervising physician I was immediately available for consultation/collaboration.  EKG Interpretation   None         Elsi Stelzer N Marrianne Sica, DO 04/29/13 1720 

## 2013-04-29 NOTE — ED Notes (Signed)
Pt c/o right sided flank and abd pain x 3 days becoming more severe; pt tearful

## 2013-04-29 NOTE — ED Provider Notes (Signed)
CSN: [id number]     Arrival date & time 04/29/13  1503 History   First MD Initiated Contact with Patient 04/29/13 1538     Chief Complaint  Patient presents with  . Abdominal Pain  . Flank Pain   (Consider location/radiation/quality/duration/timing/severity/associated sxs/prior Treatment) Patient is a 27 y.o. female presenting with abdominal pain and flank pain. The history is provided by the patient.  Abdominal Pain Pain location:  RLQ and RUQ Pain quality: fullness and sharp   Pain radiates to:  Back Pain severity:  Severe Onset quality:  Gradual Duration:  3 days Timing:  Intermittent Progression:  Worsening Chronicity:  New Context: not alcohol use, not diet changes, not eating, not medication withdrawal, not recent travel, not retching and not trauma   Relieved by:  Nothing Worsened by:  Nothing tried Ineffective treatments:  None tried Associated symptoms: no anorexia, no constipation, no diarrhea, no dysuria, no fatigue, no fever, no flatus, no sore throat, no vaginal discharge and no vomiting   Risk factors: not pregnant and no recent hospitalization   Flank Pain Associated symptoms include abdominal pain. Pertinent negatives include no anorexia, fatigue, fever, sore throat or vomiting.    Courtney [name] is a 27 y.o.female without any significant PMH presents to the ER with complaints of right sided abdominal pain for three days that has been progressively getting worse. Denies ever having pain like this before. No nausea or vomiting. No bowel movements.      Past Medical History  Diagnosis Date  . Viral meningitis   . Anemia   . History of recurrent UTI (urinary tract infection)    Past Surgical History  Procedure Laterality Date  . Tooth extraction    . Wisdom tooth extraction     Family History  Problem Relation Age of Onset  . Thyroid cancer Mother   . Thyroid nodules Mother   . Thyroid nodules Sister    History  Substance Use Topics  . Smoking status:  Never Smoker   . Smokeless tobacco: Never Used  . Alcohol Use: No   OB History   Grav Para Term Preterm Abortions TAB SAB Ect Mult Living   2 1 1  1  1   1      Review of Systems  Constitutional: Negative for fever and fatigue.  HENT: Negative for sore throat.   Gastrointestinal: Positive for abdominal pain. Negative for vomiting, diarrhea, constipation, anorexia and flatus.  Genitourinary: Positive for flank pain. Negative for dysuria and vaginal discharge.    Allergies  Review of patient's allergies indicates no known allergies.  Home Medications   Current Outpatient Rx  Name  Route  Sig  Dispense  Refill  . ibuprofen (ADVIL,MOTRIN) 200 MG tablet   Oral   Take 200 mg by mouth every 6 (six) hours as needed for moderate pain.         Marland Kitchen levonorgestrel (MIRENA) 20 MCG/24HR IUD   Intrauterine   1 each by Intrauterine route once.         . ciprofloxacin (CIPRO) 500 MG tablet   Oral   Take 1 tablet (500 mg total) by mouth 2 (two) times daily.   28 tablet   0   . HYDROcodone-acetaminophen (NORCO/VICODIN) 5-325 MG per tablet   Oral   Take 1-2 tablets by mouth every 4 (four) hours as needed.   20 tablet   0   . ondansetron (ZOFRAN) 4 MG tablet   Oral   Take 1 tablet (4 mg total)  by mouth every 6 (six) hours.   12 tablet   0    BP 90/53  Pulse 71  Temp(Src) 97.9 F (36.6 C) (Oral)  Resp 18  SpO2 100% Physical Exam  Nursing note and vitals reviewed. Constitutional: She appears well-developed and well-nourished. No distress.  HENT:  Head: Normocephalic and atraumatic.  Eyes: Pupils are equal, round, and reactive to light.  Neck: Normal range of motion. Neck supple.  Cardiovascular: Normal rate and regular rhythm.   Pulmonary/Chest: Effort normal.  Abdominal: Soft. There is tenderness in the right upper quadrant and right lower quadrant. There is guarding and CVA tenderness. There is no rigidity, no rebound, no tenderness at McBurney's point and negative  Murphy's sign.    Neurological: She is alert.  Skin: Skin is warm and dry.    ED Course  Procedures (including critical care time) Labs Review Labs Reviewed  CBC WITH DIFFERENTIAL - Abnormal; Notable for the following:    HCT 35.8 (*)    MCV 74.6 (*)    MCH 25.2 (*)    All other components within normal limits  COMPREHENSIVE METABOLIC PANEL - Abnormal; Notable for the following:    Potassium 3.4 (*)    All other components within normal limits  URINALYSIS, ROUTINE W REFLEX MICROSCOPIC - Abnormal; Notable for the following:    APPearance TURBID (*)    Hgb urine dipstick LARGE (*)    Ketones, ur 15 (*)    Protein, ur 100 (*)    Nitrite POSITIVE (*)    Leukocytes, UA LARGE (*)    All other components within normal limits  URINE MICROSCOPIC-ADD ON - Abnormal; Notable for the following:    Squamous Epithelial / LPF MANY (*)    Bacteria, UA MANY (*)    All other components within normal limits  URINE CULTURE  LIPASE, BLOOD  POCT PREGNANCY, URINE   Imaging Review No results found.  EKG Interpretation   None       MDM   1. Pyelonephritis     Clinically patient has pyelo and urine is consistent with such.   Unlikely appendicitis due to no white count without nausea, vomiting or fevers. Pain has been for 3 days. Unlikely pancreatitis or gall bladder abnormality due to labs being WNL.   IV Rocephin given in ED and pain managed.  Rx Cipro for two weeks with pain and nausea medication. She is non toxic and is  A young and healthy candidate who should do well at home.   27 y.o.Courtney [name]'s evaluation in the Emergency Department is complete. It has been determined that no acute conditions requiring further emergency intervention are present at this time. The patient/guardian have been advised of the diagnosis and plan. We have discussed signs and symptoms that warrant return to the ED, such as changes or worsening in symptoms.  Vital signs are stable at discharge. Filed  Vitals:   04/29/13 1645  BP: 90/53  Pulse: 71  Temp:   Resp:     Patient/guardian has voiced understanding and agreed to follow-up with the PCP or specialist.     Dorthula Matas, PA-C 04/29/13 1709

## 2013-05-02 LAB — URINE CULTURE

## 2013-05-03 ENCOUNTER — Telehealth (HOSPITAL_COMMUNITY): Payer: Self-pay | Admitting: Emergency Medicine

## 2013-05-03 NOTE — ED Notes (Signed)
Post ED Visit - Positive Culture Follow-up  Culture report reviewed by antimicrobial stewardship pharmacist: []  Wes Dulaney, Pharm.D., BCPS [x]  Celedonio Miyamoto, Pharm.D., BCPS []  Georgina Pillion, [address].D., BCPS []  [place], [address].D., BCPS, AAHIVP []  Estella Husk, Pharm.D., BCPS, AAHIVP  Positive urine culture Treated with Cipro, organism sensitive to the same and no further patient follow-up is required at this time.  Kylie A Holland 05/03/2013, 1:35 PM

## 2014-04-03 ENCOUNTER — Encounter (HOSPITAL_COMMUNITY): Payer: Self-pay | Admitting: Emergency Medicine

## 2016-10-17 ENCOUNTER — Ambulatory Visit (HOSPITAL_COMMUNITY)
Admission: EM | Admit: 2016-10-17 | Discharge: 2016-10-17 | Disposition: A | Discharge status: Home / Self Care | Payer: BLUE CROSS/BLUE SHIELD | Attending: Internal Medicine | Admitting: Internal Medicine

## 2016-10-17 ENCOUNTER — Encounter (HOSPITAL_COMMUNITY): Payer: Self-pay

## 2016-10-17 DIAGNOSIS — J029 Acute pharyngitis, unspecified: Secondary | ICD-10-CM

## 2016-10-17 MED ORDER — GUAIFENESIN 100 MG/5ML PO SYRP: 200.0000 mg | ORAL_SOLUTION | Freq: Four times a day (QID) | ORAL | 0 refills | Status: DC | PRN

## 2016-10-17 MED ORDER — DIPHENHYDRAMINE HCL 25 MG PO CAPS: 25.0000 mg | ORAL_CAPSULE | Freq: Every evening | ORAL | 0 refills | Status: DC | PRN

## 2016-10-17 NOTE — ED Provider Notes (Signed)
CSN: [id number]     Arrival date & time 10/17/16  1539 History   First MD Initiated Contact with Patient 10/17/16 1656     Chief Complaint  Patient presents with  . Cough   (Consider location/radiation/quality/duration/timing/severity/associated sxs/prior Treatment)  Courtney [name] is a 31 y.o. female who presents for evaluation of a sore throat. Associated symptoms include dry cough. Onset of symptoms was 1 week ago and has been waxing and waning since that time. She is drinking plenty of fluids. She has not had recent close exposure to someone with proven streptococcal pharyngitis. The following portions of the patient's history were reviewed and updated as appropriate: allergies, current medications, past family history, past medical history, past social history, past surgical history and problem list.         Past Medical History:  Diagnosis Date  . Anemia   . History of recurrent UTI (urinary tract infection)   . Viral meningitis    Past Surgical History:  Procedure Laterality Date  . TOOTH EXTRACTION    . WISDOM TOOTH EXTRACTION     Family History  Problem Relation Age of Onset  . Thyroid cancer Mother   . Thyroid nodules Mother   . Thyroid nodules Sister    Social History  Substance Use Topics  . Smoking status: Never Smoker  . Smokeless tobacco: Never Used  . Alcohol use No   OB History    Gravida Para Term Preterm AB Living   2 1 1   1 1    SAB TAB Ectopic Multiple Live Births   1       1     Review of Systems  Constitutional: Negative for chills, diaphoresis and fever.  HENT: Positive for sore throat. Negative for congestion, ear pain, postnasal drip, rhinorrhea, sneezing and trouble swallowing.   Eyes: Negative for itching.  Respiratory: Positive for cough. Negative for shortness of breath.   Cardiovascular: Negative for chest pain.  Gastrointestinal: Negative for nausea and vomiting.    Allergies  Patient has no known allergies.  Home Medications    Prior to Admission medications   Medication Sig Start Date End Date Taking? Authorizing Provider  levonorgestrel (MIRENA) 20 MCG/24HR IUD 1 each by Intrauterine route once.   Yes [provider]  ciprofloxacin (CIPRO) 500 MG tablet Take 1 tablet (500 mg total) by mouth 2 (two) times daily. 04/29/13   Marlon Pel, PA-C  HYDROcodone-acetaminophen (NORCO/VICODIN) 5-325 MG per tablet Take 1-2 tablets by mouth every 4 (four) hours as needed. 04/29/13   Marlon Pel, PA-C  ibuprofen (ADVIL,MOTRIN) 200 MG tablet Take 200 mg by mouth every 6 (six) hours as needed for moderate pain.    [provider]  ondansetron (ZOFRAN) 4 MG tablet Take 1 tablet (4 mg total) by mouth every 6 (six) hours. 04/29/13   Marlon Pel, PA-C   Meds Ordered and Administered this Visit  Medications - No data to display  BP (!) 99/54 (BP Location: Right Arm) Comment: notified cma  Pulse 64   Temp 98.2 F (36.8 C) (Oral)   Resp 12   LMP 10/13/2016 (Exact Date)   SpO2 100%  No data found.   Physical Exam  Constitutional: She is oriented to person, place, and time. She appears well-developed and well-nourished.  HENT:  Head: Normocephalic.  Right Ear: External ear normal.  Left Ear: External ear normal.  Nose: Nose normal.  Mouth/Throat: Oropharynx is clear and moist. No oropharyngeal exudate.  Eyes: Conjunctivae and EOM are normal.  Pupils are equal, round, and reactive to light.  Neck: Normal range of motion.  Cardiovascular: Normal rate and regular rhythm.   Pulmonary/Chest: Effort normal and breath sounds normal.  Musculoskeletal: Normal range of motion.  Lymphadenopathy:    She has no cervical adenopathy.  Neurological: She is alert and oriented to person, place, and time.  Skin: Skin is warm and dry.  Psychiatric: She has a normal mood and affect.    Urgent Care Course     Procedures (including critical care time)  Labs Review Labs Reviewed - No data to  display  Imaging Review No results found.   Visual Acuity Review  Right Eye Distance:   Left Eye Distance:   Bilateral Distance:    Right Eye Near:   Left Eye Near:    Bilateral Near:         MDM   1. Viral pharyngitis    Rx benadryl QHS as needed. Guaifenesin four times daily for cough.  Use of OTC analgesics recommended as well as salt water gargles. Follow up as needed.  Discussed diagnosis and treatment with patient. All questions have been answered and all concerns have been addressed. The patient verbalized understanding and had no further questions     Lurline IdolMurrill, Fleet Higham, [place] 10/17/16 1704

## 2016-10-17 NOTE — ED Triage Notes (Signed)
Pt taking mucinex but having a dry cough, sore throat and loss of voice for over a week. No fever.

## 2017-04-15 DIAGNOSIS — Z681 Body mass index (BMI) 19 or less, adult: Secondary | ICD-10-CM | POA: Diagnosis not present

## 2017-04-15 DIAGNOSIS — Z113 Encounter for screening for infections with a predominantly sexual mode of transmission: Secondary | ICD-10-CM | POA: Diagnosis not present

## 2017-04-15 DIAGNOSIS — Z139 Encounter for screening, unspecified: Secondary | ICD-10-CM | POA: Diagnosis not present

## 2017-04-15 DIAGNOSIS — Z01419 Encounter for gynecological examination (general) (routine) without abnormal findings: Secondary | ICD-10-CM | POA: Diagnosis not present

## 2017-04-15 DIAGNOSIS — Z124 Encounter for screening for malignant neoplasm of cervix: Secondary | ICD-10-CM | POA: Diagnosis not present

## 2017-04-21 DIAGNOSIS — Z30430 Encounter for insertion of intrauterine contraceptive device: Secondary | ICD-10-CM | POA: Diagnosis not present

## 2017-05-28 DIAGNOSIS — Z30431 Encounter for routine checking of intrauterine contraceptive device: Secondary | ICD-10-CM | POA: Diagnosis not present

## 2018-05-04 DIAGNOSIS — N939 Abnormal uterine and vaginal bleeding, unspecified: Secondary | ICD-10-CM | POA: Diagnosis not present

## 2018-05-04 DIAGNOSIS — Z01419 Encounter for gynecological examination (general) (routine) without abnormal findings: Secondary | ICD-10-CM | POA: Diagnosis not present

## 2018-05-20 DIAGNOSIS — N939 Abnormal uterine and vaginal bleeding, unspecified: Secondary | ICD-10-CM | POA: Diagnosis not present

## 2018-05-20 DIAGNOSIS — E559 Vitamin D deficiency, unspecified: Secondary | ICD-10-CM | POA: Diagnosis not present

## 2018-05-20 DIAGNOSIS — R5383 Other fatigue: Secondary | ICD-10-CM | POA: Diagnosis not present

## 2018-09-14 DIAGNOSIS — N83209 Unspecified ovarian cyst, unspecified side: Secondary | ICD-10-CM | POA: Diagnosis not present

## 2018-09-14 DIAGNOSIS — E559 Vitamin D deficiency, unspecified: Secondary | ICD-10-CM | POA: Diagnosis not present

## 2018-09-14 DIAGNOSIS — N83202 Unspecified ovarian cyst, left side: Secondary | ICD-10-CM | POA: Diagnosis not present

## 2018-11-16 ENCOUNTER — Other Ambulatory Visit: Payer: Self-pay

## 2018-11-16 ENCOUNTER — Encounter (HOSPITAL_COMMUNITY): Payer: Self-pay

## 2018-11-16 ENCOUNTER — Emergency Department (HOSPITAL_COMMUNITY)
Admission: EM | Admit: 2018-11-16 | Discharge: 2018-11-17 | Disposition: A | Discharge status: Home / Self Care | Payer: BLUE CROSS/BLUE SHIELD | Attending: Emergency Medicine | Admitting: Emergency Medicine

## 2018-11-16 DIAGNOSIS — F411 Generalized anxiety disorder: Secondary | ICD-10-CM

## 2018-11-16 DIAGNOSIS — R002 Palpitations: Secondary | ICD-10-CM | POA: Diagnosis present

## 2018-11-16 DIAGNOSIS — Z79899 Other long term (current) drug therapy: Secondary | ICD-10-CM | POA: Diagnosis not present

## 2018-11-16 LAB — BASIC METABOLIC PANEL
Anion gap: 11 (ref 5–15)
BUN: 7 mg/dL (ref 6–20)
CO2: 23 mmol/L (ref 22–32)
Calcium: 9.7 mg/dL (ref 8.9–10.3)
Chloride: 101 mmol/L (ref 98–111)
Creatinine, Ser: 0.62 mg/dL (ref 0.44–1.00)
GFR calc Af Amer: >60 mL/min (ref >60)
GFR calc non Af Amer: >60 mL/min (ref >60)
Glucose, Bld: 116 mg/dL — ABNORMAL HIGH (ref 70–99)
Potassium: 3.5 mmol/L (ref 3.5–5.1)
Sodium: 135 mmol/L (ref 135–145)

## 2018-11-16 LAB — CBC
HCT: 38.2 % (ref 36.0–46.0)
Hemoglobin: 11.9 g/dL — ABNORMAL LOW (ref 12.0–15.0)
MCH: 24 pg — ABNORMAL LOW (ref 26.0–34.0)
MCHC: 31.2 g/dL (ref 30.0–36.0)
MCV: 77.2 fL — ABNORMAL LOW (ref 80.0–100.0)
Platelets: 287 10*3/uL (ref 150–400)
RBC: 4.95 MIL/uL (ref 3.87–5.11)
RDW: 11.9 % (ref 11.5–15.5)
WBC: 9.8 10*3/uL (ref 4.0–10.5)
nRBC: 0 % (ref 0.0–0.2)

## 2018-11-16 LAB — URINALYSIS, ROUTINE W REFLEX MICROSCOPIC
Bilirubin Urine: NEGATIVE
Glucose, UA: NEGATIVE mg/dL
Hgb urine dipstick: NEGATIVE
Ketones, ur: NEGATIVE mg/dL
Leukocytes,Ua: NEGATIVE
Nitrite: NEGATIVE
Protein, ur: NEGATIVE mg/dL
Specific Gravity, Urine: 1.004 — ABNORMAL LOW (ref 1.005–1.030)
pH: 7 (ref 5.0–8.0)

## 2018-11-16 LAB — I-STAT BETA HCG BLOOD, ED (MC, WL, AP ONLY): I-stat hCG, quantitative: <5 m[IU]/mL (ref <5)

## 2018-11-16 MED ORDER — SODIUM CHLORIDE 0.9% FLUSH: 3.0000 mL | Freq: Once | INTRAVENOUS | Status: DC

## 2018-11-16 MED ORDER — HYDROXYZINE HCL 25 MG PO TABS
25.0000 mg | ORAL_TABLET | Freq: Once | ORAL | Status: AC
  Administered 2018-11-16: 25 mg via ORAL
  Filled 2018-11-16: qty 1

## 2018-11-16 NOTE — ED Triage Notes (Signed)
Onset today around 1pm pt started feeling like heart is racing and shortness of breath.   Splashing cold water on face seems to help.   Nothing makes worse.   Pt has been having frequent bleeding since Jan, d/t cyst, sees Ob/GYN and was prescribed iron and Vit D3.   May had regular cycle, then had 3 cycles within 5-6 weeks.  Last cycle ended last week.  No c/o dizziness.

## 2018-11-16 NOTE — ED Provider Notes (Signed)
[facility] EMERGENCY DEPARTMENT Provider Note   CSN: [id number] Arrival date & time: 11/16/18  2134    History   Chief Complaint Chief Complaint  Patient presents with  . Anxiety    HPI Courtney [name] is a 33 y.o. female.     33 year old female with a history of viral meningitis, recurrent UTI, anemia presents to the ED for evaluation of palpitations.  She states that she has had episodes lasting a few minutes where she feels as though her heart is beating rapidly.  When she is experiencing palpitations, she reports shortness of breath.  Symptoms began around 1300 today and has been recurrent.  Splashing cold water on her face seems to help.  She is unsure of any inciting symptoms.  Does have a history of anxiety a number of years ago, but states this feels different.  She has not been under any increased stress.  States that she has been sleeping well at nighttime.  She had a cup of coffee earlier today, but this is not unusual for her.  Denies any other excessive caffeine intake as well as any illicit drug use.  No new medications or associated fevers, chest pain, syncope or near syncope, facial or extremity paresthesias, extremity weakness, nausea, vomiting, leg swelling, hemoptysis, recent surgeries or hospitalizations.  She does have an IUD in place.  The history is provided by the patient. No language interpreter was used.  Anxiety    Past Medical History:  Diagnosis Date  . Anemia   . History of recurrent UTI (urinary tract infection)   . Viral meningitis     Patient Active Problem List   Diagnosis Date Noted  . Contraception, device intrauterine-Mirena inserted 02/23/12 02/23/2012    Past Surgical History:  Procedure Laterality Date  . TOOTH EXTRACTION    . WISDOM TOOTH EXTRACTION       OB History    Gravida  2   Para  1   Term  1   Preterm      AB  1   Living  1     SAB  1   TAB      Ectopic      Multiple      Live Births  1           Home Medications    Prior to Admission medications   Medication Sig Start Date End Date Taking? Authorizing Provider  levonorgestrel (MIRENA) 20 MCG/24HR IUD 1 each by Intrauterine route once.   Yes [provider]  Multiple Vitamin (MULTIVITAMIN WITH MINERALS) TABS tablet Take 1 tablet by mouth daily.   Yes [provider]  diphenhydrAMINE (BENADRYL) 25 mg capsule Take 1 capsule (25 mg total) by mouth at bedtime as needed for allergies. Patient not taking: Reported on 11/17/2018 10/17/16   Enrique Sack, FNP  guaifenesin (ROBITUSSIN) 100 MG/5ML syrup Take 10 mLs (200 mg total) by mouth 4 (four) times daily as needed for cough. Patient not taking: Reported on 11/17/2018 10/17/16   Enrique Sack, FNP  propranolol (INDERAL) 10 MG tablet Take 1 tablet (10 mg total) by mouth every 12 (twelve) hours as needed (for anxiety). 11/17/18   Antonietta Breach, PA-C    Family History Family History  Problem Relation Age of Onset  . Thyroid cancer Mother   . Thyroid nodules Mother   . Thyroid nodules Sister     Social History Social History   Tobacco Use  . Smoking status: Never Smoker  .  Smokeless tobacco: Never Used  Substance Use Topics  . Alcohol use: No  . Drug use: No     Allergies   Patient has no known allergies.   Review of Systems Review of Systems Ten systems reviewed and are negative for acute change, except as noted in the HPI.    Physical Exam Updated Vital Signs BP 103/63   Pulse 77   Temp 98.6 F (37 C) (Oral)   Resp 14   LMP 11/07/2018   SpO2 99%   Physical Exam Vitals signs and nursing note reviewed.  Constitutional:      General: She is not in acute distress.    Appearance: She is well-developed. She is not diaphoretic.     Comments: Nontoxic appearing and in NAD  HENT:     Head: Normocephalic and atraumatic.  Eyes:     General: No scleral icterus.    Conjunctiva/sclera: Conjunctivae normal.  Neck:     Musculoskeletal:  Normal range of motion.  Cardiovascular:     Rate and Rhythm: Regular rhythm. Tachycardia present.     Pulses: Normal pulses.     Comments: Intermittent tachycardia.  Pulmonary:     Effort: Pulmonary effort is normal. No respiratory distress.     Breath sounds: No stridor. No wheezing or rales.     Comments: Lungs CTAB. Respirations even and unlabored. Musculoskeletal: Normal range of motion.     Right lower leg: No edema.     Left lower leg: No edema.  Skin:    General: Skin is warm and dry.     Coloration: Skin is not pale.     Findings: No erythema or rash.  Neurological:     General: No focal deficit present.     Mental Status: She is alert and oriented to person, place, and time.     Coordination: Coordination normal.     Comments: GCS 15. Patient moving all extremities spontaneously.  Psychiatric:        Behavior: Behavior normal.      ED Treatments / Results  Labs (all labs ordered are listed, but only abnormal results are displayed) Labs Reviewed  BASIC METABOLIC PANEL - Abnormal; Notable for the following components:      Result Value   Glucose, Bld 116 (*)    All other components within normal limits  CBC - Abnormal; Notable for the following components:   Hemoglobin 11.9 (*)    MCV 77.2 (*)    MCH 24.0 (*)    All other components within normal limits  URINALYSIS, ROUTINE W REFLEX MICROSCOPIC - Abnormal; Notable for the following components:   Color, Urine STRAW (*)    Specific Gravity, Urine 1.004 (*)    All other components within normal limits  D-DIMER, QUANTITATIVE (NOT AT [facility])  I-STAT BETA HCG BLOOD, ED ([facility], [facility], [facility] ONLY)    EKG EKG Interpretation  Date/Time:  Tuesday November 16 2018 21:42:25 EDT Ventricular Rate:  97 PR Interval:  124 QRS Duration: 86 QT Interval:  344 QTC Calculation: 436 R Axis:   79 Text Interpretation:  Sinus rhythm with marked sinus arrhythmia Cannot rule out Inferior infarct , age undetermined Abnormal ECG No old tracing to  compare Confirmed by Ward, Baxter HireKristen [phone]([id number]) on 11/17/2018 2:00:46 AM   Radiology No results found.  Procedures Procedures (including critical care time)  Medications Ordered in ED Medications  sodium chloride flush (NS) 0.9 % injection 3 mL (has no administration in time range)  hydrOXYzine (ATARAX/VISTARIL) tablet 25  mg (25 mg Oral Given 11/16/18 2333)  LORazepam (ATIVAN) tablet 1 mg (1 mg Oral Given 11/17/18 0114)    1:00 AM Patient called out to nursing complaining of palpitations and shortness of breath.  She was given Atarax at 0030.  Will add tablet of Ativan for management of presumed anxiety.  D-dimer pending.  1:55 AM D-dimer resulted and is negative.   Initial Impression / Assessment and Plan / ED Course  I have reviewed the triage vital signs and the nursing notes.  Pertinent labs & imaging results that were available during my care of the patient were reviewed by me and considered in my medical decision making (see chart for details).        33 year old female presents to the emergency department for palpitations associated with shortness of breath.  No associated chest pain or fevers.  Patient endorsing a history of anxiety, but she has not had issues with anxiety for quite some time.  She had a similar episode while being evaluated in the emergency department.  Was visibly anxious appearing.  Never experienced hypoxia/acute desaturation.  Patient given Ativan as well as Atarax for management.  Her EKG is notable for sinus arrhythmia.  Laboratory work-up reassuring.  Patient advised to avoid the use of stimulants until symptoms resolve and no longer recur.  Given her intermittent tachycardia, mostly upon my presentation to her exam room, feel she would benefit most from propranolol for anxiety management.  Patient encouraged to follow-up with her primary care doctor for recheck.  Return precautions discussed and provided. Patient discharged in stable condition with no  unaddressed concerns.   Vitals:   11/17/18 0100 11/17/18 0115 11/17/18 0218 11/17/18 0230  BP: 113/76  105/66 103/63  Pulse: (!) 102  (!) 103 77  Resp: 16 (!) 26 15 14   Temp:      TempSrc:      SpO2: 100%  99% 99%    Final Clinical Impressions(s) / ED Diagnoses   Final diagnoses:  Anxiety state    ED Discharge Orders         Ordered    propranolol (INDERAL) 10 MG tablet  Every 12 hours PRN     11/17/18 0232           Antony MaduraHumes, Orilla Templeman, PA-C 11/17/18 0240    Ward, Layla MawKristen N, DO 11/17/18 [phone]

## 2018-11-17 LAB — D-DIMER, QUANTITATIVE (NOT AT ARMC): D-Dimer, Quant: <0.27 ug/mL-FEU (ref 0.00–0.50)

## 2018-11-17 MED ORDER — PROPRANOLOL HCL 10 MG PO TABS: 10.0000 mg | ORAL_TABLET | Freq: Two times a day (BID) | ORAL | 1 refills | Status: DC | PRN

## 2018-11-17 MED ORDER — LORAZEPAM 1 MG PO TABS
1.0000 mg | ORAL_TABLET | Freq: Once | ORAL | Status: AC
Start: 2018-11-17 — End: 2018-11-17
  Administered 2018-11-17: 1 mg via ORAL
  Filled 2018-11-17: qty 1

## 2018-11-17 NOTE — ED Notes (Signed)
Pt. States some improvement of fluttering sensation.

## 2018-11-17 NOTE — ED Notes (Signed)
Discharge instructions reviewed with pt. Pt. Verbalized understanding. Pt to go home with husband. Pt. To follow up with PCP.   Pt. Wheelchair to car.

## 2018-11-17 NOTE — Discharge Instructions (Signed)
Avoid the use of stimulants such as caffeine to try and prevent recurrence of worsening anxiety.  You have been prescribed propranolol which you can take for management of anxiety, every 12 hours as needed.  Follow-up closely with a primary care doctor to ensure that symptoms resolve.  You may return to the ED for new or concerning symptoms.

## 2018-11-22 DIAGNOSIS — R002 Palpitations: Secondary | ICD-10-CM | POA: Diagnosis not present

## 2018-11-22 DIAGNOSIS — Z3040 Encounter for surveillance of contraceptives, unspecified: Secondary | ICD-10-CM | POA: Diagnosis not present

## 2018-11-22 DIAGNOSIS — Z30432 Encounter for removal of intrauterine contraceptive device: Secondary | ICD-10-CM | POA: Diagnosis not present

## 2018-12-07 ENCOUNTER — Ambulatory Visit
Admission: RE | Admit: 2018-12-07 | Discharge: 2018-12-07 | Disposition: A | Discharge status: Home / Self Care | Payer: BLUE CROSS/BLUE SHIELD | Source: Ambulatory Visit | Attending: Family Medicine | Admitting: Family Medicine

## 2018-12-07 ENCOUNTER — Other Ambulatory Visit: Payer: Self-pay | Admitting: Family Medicine

## 2018-12-07 DIAGNOSIS — R0602 Shortness of breath: Secondary | ICD-10-CM

## 2018-12-07 DIAGNOSIS — R002 Palpitations: Secondary | ICD-10-CM | POA: Diagnosis not present

## 2018-12-07 DIAGNOSIS — N939 Abnormal uterine and vaginal bleeding, unspecified: Secondary | ICD-10-CM | POA: Diagnosis not present

## 2018-12-07 DIAGNOSIS — F419 Anxiety disorder, unspecified: Secondary | ICD-10-CM | POA: Diagnosis not present

## 2018-12-07 DIAGNOSIS — D508 Other iron deficiency anemias: Secondary | ICD-10-CM | POA: Diagnosis not present

## 2018-12-21 ENCOUNTER — Ambulatory Visit: Payer: BLUE CROSS/BLUE SHIELD | Admitting: Cardiovascular Disease

## 2018-12-21 ENCOUNTER — Other Ambulatory Visit: Payer: Self-pay

## 2018-12-21 ENCOUNTER — Encounter: Payer: Self-pay | Admitting: Cardiovascular Disease

## 2018-12-21 VITALS — BP 90/62 | HR 70 | Ht 62.0 in | Wt 91.6 lb

## 2018-12-21 DIAGNOSIS — R0789 Other chest pain: Secondary | ICD-10-CM | POA: Diagnosis not present

## 2018-12-21 DIAGNOSIS — R002 Palpitations: Secondary | ICD-10-CM

## 2018-12-21 NOTE — Progress Notes (Signed)
Cardiology Office Note:    Date:  12/23/2018   ID:  [name], DOB December 26, 1985, MRN [id number]  PCP:  Patient, No Pcp Per  Cardiologist:  No primary care provider on file.  Electrophysiologist:  None   Referring MD: Osborn Cohooberts, Angela, MD   Chief Complaint  Patient presents with  . Palpitations  . Chest Pain    History of Present Illness:    Courtney [name] is a 33 y.o. female with a hx of generally good health and no CV risk factors, who has recently developed racing heart beat and mild chest tightness. This clearly developed in parallel with severe menorrhagia and has spontaneously improved as menorrhagia ghas improved, especially following removal of IUD. She was seen in the ED for these complaints on June 16.  Palpitations on that day gradually worsened, lasted all day and then gradually improved. ECG showed sinus rhythm/borderline sinus tachycardia at 97 bpm with prominent sinus arrhythmia of youth. Labs showed mild microcytic anemia.  She had mild chest tightness off and on, not exercise related.  The patient specifically denies dyspnea at rest or with exertion, orthopnea, paroxysmal nocturnal dyspnea, syncope, palpitations, focal neurological deficits, intermittent claudication, lower extremity edema, unexplained weight gain, cough, hemoptysis or wheezing.   Past Medical History:  Diagnosis Date  . Anemia   . History of recurrent UTI (urinary tract infection)   . Viral meningitis     Past Surgical History:  Procedure Laterality Date  . TOOTH EXTRACTION    . WISDOM TOOTH EXTRACTION      Current Medications: Current Meds  Medication Sig  . b complex vitamins tablet Take 1 tablet by mouth daily.  . Ferrous Sulfate (IRON PO) Take by mouth daily.  . Multiple Vitamin (MULTIVITAMIN WITH MINERALS) TABS tablet Take 1 tablet by mouth daily.  Marland Kitchen. omeprazole (PRILOSEC) 40 MG capsule Take 1 capsule by mouth daily.  . propranolol (INDERAL) 10 MG tablet Take 1 tablet (10 mg total) by  mouth every 12 (twelve) hours as needed (for anxiety).     Allergies:   Patient has no known allergies.   Social History   Socioeconomic History  . Marital status: Single    Spouse name: Not on file  . Number of children: Not on file  . Years of education: Not on file  . Highest education level: Not on file  Occupational History  . Not on file  Social Needs  . Financial resource strain: Not on file  . Food insecurity    Worry: Not on file    Inability: Not on file  . Transportation needs    Medical: Not on file    Non-medical: Not on file  Tobacco Use  . Smoking status: Never Smoker  . Smokeless tobacco: Never Used  Substance and Sexual Activity  . Alcohol use: No  . Drug use: No  . Sexual activity: Yes  Lifestyle  . Physical activity    Days per week: Not on file    Minutes per session: Not on file  . Stress: Not on file  Relationships  . Social Musicianconnections    Talks on phone: Not on file    Gets together: Not on file    Attends religious service: Not on file    Active member of club or organization: Not on file    Attends meetings of clubs or organizations: Not on file    Relationship status: Not on file  Other Topics Concern  . Not on file  Social  History Narrative  . Not on file     Family History: The patient's family history includes Thyroid cancer in her mother; Thyroid nodules in her mother and sister.  ROS:   Please see the history of present illness.     All other systems reviewed and are negative.  EKGs/Labs/Other Studies Reviewed:    The following studies were reviewed today: Notes, labs, ECG, imaging studies from ED 6/16. Notes from Ob/gyn.  EKG:  EKG is ordered today.  The ekg ordered today demonstrates NSR, normal tracing  Recent Labs: 11/16/2018: BUN 7; Creatinine, Ser 0.62; Hemoglobin 11.9; Platelets 287; Potassium 3.5; Sodium 135  Recent Lipid Panel No results found for: CHOL, TRIG, HDL, CHOLHDL, VLDL, LDLCALC, LDLDIRECT  Physical  Exam:    VS:  BP 90/62   Pulse 70   Ht 5\' 2"  (1.575 m)   Wt 91 lb 9.6 oz (41.5 kg)   BMI 16.75 kg/m     Wt Readings from Last 3 Encounters:  12/21/18 91 lb 9.6 oz (41.5 kg)  03/31/12 92 lb (41.7 kg)  02/23/12 95 lb (43.1 kg)     GEN: Very thin, well developed in no acute distress HEENT: Normal NECK: No JVD; No carotid bruits LYMPHATICS: No lymphadenopathy CARDIAC: RRR, no murmurs, rubs, gallops RESPIRATORY:  Clear to auscultation without rales, wheezing or rhonchi  ABDOMEN: Soft, non-tender, non-distended MUSCULOSKELETAL:  No edema; No deformity  SKIN: Warm and dry NEUROLOGIC:  Alert and oriented x 3 PSYCHIATRIC:  Normal affect   ASSESSMENT:    1. Palpitations   2. Chest tightness    PLAN:    In order of problems listed above:  1. Palpitations: the patient's subjective description and the circumstances are most consistent with physiological sinus tachycardia. I do not think that she had a different arrhythmia. Her physical exam does not show any evidence of structural heart disease. Symptoms are already better. Consider a Kardia self-monitoring device for additional reassurance if palpitations persist. She can send me symptom-driven recordings via mychart. 2. Chest pain: atypical, not suggestive of CAD. Very low risk profile. ECG during symptoms was reassuring. Consider echo only if symptoms are persistent. 3. Anemia: likely iron deficiency in view of menorrhagia, but note normal RBC number, consider a component of alpha thalassemia trait, especially with her [place] ethnic background. 4. Underweight   Medication Adjustments/Labs and Tests Ordered: Current medicines are reviewed at length with the patient today.  Concerns regarding medicines are outlined above.  Orders Placed This Encounter  Procedures  . EKG 12-Lead   No orders of the defined types were placed in this encounter.   Patient Instructions  Medication Instructions:  Your physician recommends that you  continue on your current medications as directed. Please refer to the Current Medication list given to you today.  If you need a refill on your cardiac medications before your next appointment, please call your pharmacy.   Lab work: None ordered If you have labs (blood work) drawn today and your tests are completely normal, you will receive your results only by: MyChart Message (if you have MyChart) OR A paper copy in the mail If you have any lab test that is abnormal or we need to change your treatment, we will call you to review the results.  Testing/Procedures: None ordered  Follow-Up: At [facility], you and your health needs are our priority.  As part of our continuing mission to provide you with exceptional heart care, we have created designated Provider Care Teams.  These Care Teams include your primary Cardiologist (physician) and Advanced Practice Providers (APPs -  Physician Assistants and Nurse Practitioners) who all work together to provide you with the care you need, when you need it. You will need a follow up appointment in 6 months.  Please call our office 2 months in advance to schedule this appointment.  You may see Sanda Klein, MD or one of the following Advanced Practice Providers on your designated Care Team: Almyra Deforest, PA-C Fabian Sharp, PA-C          Signed, Sanda Klein, MD  12/23/2018 5:14 PM    [place]

## 2018-12-21 NOTE — Patient Instructions (Signed)

## 2018-12-22 DIAGNOSIS — R0789 Other chest pain: Secondary | ICD-10-CM | POA: Diagnosis not present

## 2018-12-22 DIAGNOSIS — R0602 Shortness of breath: Secondary | ICD-10-CM | POA: Diagnosis not present

## 2018-12-23 ENCOUNTER — Other Ambulatory Visit (INDEPENDENT_AMBULATORY_CARE_PROVIDER_SITE_OTHER): Payer: BLUE CROSS/BLUE SHIELD

## 2018-12-23 ENCOUNTER — Encounter: Payer: Self-pay | Admitting: Cardiovascular Disease

## 2018-12-23 DIAGNOSIS — Z975 Presence of (intrauterine) contraceptive device: Secondary | ICD-10-CM | POA: Diagnosis not present

## 2019-01-12 ENCOUNTER — Other Ambulatory Visit: Payer: Self-pay

## 2019-01-12 ENCOUNTER — Other Ambulatory Visit: Payer: Self-pay | Admitting: Family Medicine

## 2019-01-12 ENCOUNTER — Ambulatory Visit
Admission: RE | Admit: 2019-01-12 | Discharge: 2019-01-12 | Disposition: A | Discharge status: Home / Self Care | Payer: BLUE CROSS/BLUE SHIELD | Source: Ambulatory Visit | Attending: Family Medicine | Admitting: Family Medicine

## 2019-01-12 DIAGNOSIS — R918 Other nonspecific abnormal finding of lung field: Secondary | ICD-10-CM | POA: Diagnosis not present

## 2019-01-12 DIAGNOSIS — R9389 Abnormal findings on diagnostic imaging of other specified body structures: Secondary | ICD-10-CM

## 2019-01-18 ENCOUNTER — Emergency Department (HOSPITAL_COMMUNITY)
Admission: EM | Admit: 2019-01-18 | Discharge: 2019-01-18 | Disposition: A | Discharge status: Home / Self Care | Payer: BLUE CROSS/BLUE SHIELD | Attending: Emergency Medicine | Admitting: Emergency Medicine

## 2019-01-18 ENCOUNTER — Other Ambulatory Visit: Payer: Self-pay

## 2019-01-18 DIAGNOSIS — R0602 Shortness of breath: Secondary | ICD-10-CM | POA: Diagnosis not present

## 2019-01-18 DIAGNOSIS — Z79899 Other long term (current) drug therapy: Secondary | ICD-10-CM | POA: Insufficient documentation

## 2019-01-18 DIAGNOSIS — R0789 Other chest pain: Secondary | ICD-10-CM | POA: Insufficient documentation

## 2019-01-18 MED ORDER — PROAIR RESPICLICK 108 (90 BASE) MCG/ACT IN AEPB: 2.0000 | INHALATION_SPRAY | RESPIRATORY_TRACT | 0 refills | Status: DC | PRN

## 2019-01-18 NOTE — ED Triage Notes (Signed)
Pt sts she's had episodes of what she thinks are asthma attacks-shortness of breath, chest tightness, and throat swelling-intermittently x 3 weeks. Pt used an OTC inhaler with relief earlier this week but today felt like she was about to have another episode so she wanted to come get checked out. Pt had similar symptoms to this in June when she had panic attacks but thinks this is different.

## 2019-01-18 NOTE — Discharge Instructions (Signed)
Get help right away if: °Your shortness of breath gets worse. °You have shortness of breath when you are resting. °You feel light-headed or you faint. °You have a cough that is not controlled with medicines. °You cough up blood. °You have pain with breathing. °You have pain in your chest, arms, shoulders, or abdomen. °You have a fever. °You cannot walk up stairs or exercise the way that you normally do. °

## 2019-01-18 NOTE — ED Provider Notes (Signed)
Courtney [name] Provider Note   CSN: [id number] Arrival date & time: 01/18/19  1545     History   Chief Complaint Chief Complaint  Patient presents with  . Shortness of Breath    HPI [name] is a 33 y.o. female who presents emergency [name] with chief complaint of shortness of breath.  She has been seen several times for the same thing.  She is followed up with her primary care physician and had multiple chest x-rays.  She has had her thyroid studies and has also been evaluated by cardiology.  Patient complains of symptoms of sudden onset of feeling like her breathing is getting weak followed by pressure in her chest she has associated shortness of breath and sensation of throat closing and feels like she cannot breathe.  She feels panicked during these episodes.  She denies coughing.  She has been using over-the-counter Primatene Mist which seems to help alleviate her symptoms.  She states "I think I may have asthma."  She denies any wheezing or coughing.     HPI  Past Medical History:  Diagnosis Date  . Anemia   . History of recurrent UTI (urinary tract infection)   . Viral meningitis     Patient Active Problem List   Diagnosis Date Noted  . Contraception, device intrauterine-Mirena inserted 02/23/12 02/23/2012    Past Surgical History:  Procedure Laterality Date  . TOOTH EXTRACTION    . WISDOM TOOTH EXTRACTION       OB History    Gravida  2   Para  1   Term  1   Preterm      AB  1   Living  1     SAB  1   TAB      Ectopic      Multiple      Live Births  1            Home Medications    Prior to Admission medications   Medication Sig Start Date End Date Taking? Authorizing Provider  b complex vitamins tablet Take 1 tablet by mouth daily.    [provider]  Ferrous Sulfate (IRON PO) Take by mouth daily.    [provider]  Multiple Vitamin (MULTIVITAMIN WITH MINERALS) TABS tablet  Take 1 tablet by mouth daily.    [provider]  omeprazole (PRILOSEC) 40 MG capsule Take 1 capsule by mouth daily. 12/07/18   [provider]  propranolol (INDERAL) 10 MG tablet Take 1 tablet (10 mg total) by mouth every 12 (twelve) hours as needed (for anxiety). 11/17/18   Antonietta Breach, PA-C    Family History Family History  Problem Relation Age of Onset  . Thyroid cancer Mother   . Thyroid nodules Mother   . Thyroid nodules Sister     Social History Social History   Tobacco Use  . Smoking status: Never Smoker  . Smokeless tobacco: Never Used  Substance Use Topics  . Alcohol use: No  . Drug use: No     Allergies   Patient has no known allergies.   Review of Systems Review of Systems  Ten systems reviewed and are negative for acute change, except as noted in the HPI.   Physical Exam Updated Vital Signs BP 113/66 (BP Location: Right Arm)   Pulse 83   Temp 98.3 F (36.8 C) (Oral)   Resp 12   LMP 01/02/2019 (Approximate)   SpO2 99%  Physical Exam Vitals signs and nursing note reviewed.  Constitutional:      General: She is not in acute distress.    Appearance: She is well-developed. She is not diaphoretic.  HENT:     Head: Normocephalic and atraumatic.  Eyes:     General: No scleral icterus.    Conjunctiva/sclera: Conjunctivae normal.  Neck:     Musculoskeletal: Normal range of motion.  Cardiovascular:     Rate and Rhythm: Normal rate and regular rhythm.     Heart sounds: Normal heart sounds. No murmur. No friction rub. No gallop.   Pulmonary:     Effort: Pulmonary effort is normal. No respiratory distress.     Breath sounds: Normal breath sounds. No decreased breath sounds, wheezing, rhonchi or rales.  Abdominal:     General: Bowel sounds are normal. There is no distension.     Palpations: Abdomen is soft. There is no mass.     Tenderness: There is no abdominal tenderness. There is no guarding.  Skin:    General: Skin is warm and dry.   Neurological:     Mental Status: She is alert and oriented to person, place, and time.  Psychiatric:        Behavior: Behavior normal.      ED Treatments / Results  Labs (all labs ordered are listed, but only abnormal results are displayed) Labs Reviewed - No data to display  EKG None  Radiology No results found.  Procedures Procedures (including critical care time)  Medications Ordered in ED Medications - No data to display   Initial Impression / Assessment and Plan / ED Course  I have reviewed the triage vital signs and the nursing notes.  Pertinent labs & imaging results that were available during my care of the patient were reviewed by me and considered in my medical decision making (see chart for details).        Patient has had extensive work-up in the past.  She has normal vital signs and is PERC negative.  She has no wheezing time of evaluation and denies any active symptoms at this time.  She is already been in touch with her primary care physician who has given her referral to pulmonology for further evaluation.  Given her extensive work-up I do not think that any further evaluation in the emergency [name] will be of any benefit to her I discussed this with the patient.  She is in agreement and feeling well.  We will discharge her with albuterol instead of Primatene if this seems to help her symptoms she is advised to follow closely with her primary care physician and return for any new or worsening symptoms  Final Clinical Impressions(s) / ED Diagnoses   Final diagnoses:  None    ED Discharge Orders    None       Arthor CaptainHarris, Ariyona Eid, PA-C 01/18/19 1948    Maia PlanLong, Joshua G, MD 01/19/19 1018

## 2019-01-25 ENCOUNTER — Other Ambulatory Visit: Payer: Self-pay

## 2019-01-25 ENCOUNTER — Encounter: Payer: Self-pay | Admitting: Pulmonary Disease

## 2019-01-25 ENCOUNTER — Ambulatory Visit: Payer: BLUE CROSS/BLUE SHIELD | Admitting: Pulmonary Disease

## 2019-01-25 VITALS — BP 98/64 | HR 76 | Temp 98.7°F | Ht 62.0 in | Wt 91.4 lb

## 2019-01-25 DIAGNOSIS — K219 Gastro-esophageal reflux disease without esophagitis: Secondary | ICD-10-CM

## 2019-01-25 DIAGNOSIS — R0789 Other chest pain: Secondary | ICD-10-CM | POA: Diagnosis not present

## 2019-01-25 DIAGNOSIS — D509 Iron deficiency anemia, unspecified: Secondary | ICD-10-CM

## 2019-01-25 DIAGNOSIS — R0602 Shortness of breath: Secondary | ICD-10-CM

## 2019-01-25 MED ORDER — BUDESONIDE-FORMOTEROL FUMARATE 80-4.5 MCG/ACT IN AERO: 2.0000 | INHALATION_SPRAY | Freq: Two times a day (BID) | RESPIRATORY_TRACT | 0 refills | Status: DC

## 2019-01-25 NOTE — Patient Instructions (Addendum)
Thank you for visiting Dr. Valeta Harms at [facility]. Today we recommend the following:  Symbicort 80, 2 puffs twice daily  Return in about 6 weeks (around 03/08/2019).    Please do your part to reduce the spread of COVID-19.

## 2019-01-25 NOTE — Progress Notes (Signed)
Synopsis: Referred in August 2020 for shortness of breath by Courtney [name], Courtney G, PA  Subjective:   PATIENT ID: Courtney [name] GENDER: female DOB: [birth year], MRN: [id number]  Chief Complaint  Patient presents with  . Consult    Refereed by Courtney MarshallAnna Becker. She reports she developed SOB about 2 weeks ago. She reports tightness in her airways. She brought OTC inhaler prematine mist and it helped. She reports having panic attacks in the past where she developed SOB as well. She reports the rain trigger the SOB episodes.     33 year old female originally seen by primary care via telehealth video visit.  Per documentation has had a 4 to 5-week symptom onset of shortness of breath.  Intermittent in nature.  Seen in the ER for acute shortness of breath with follow-up.  She complains of intermittent associated chest tightness and a central burning in the chest.  Saw cardiology and was told that everything looked okay.  No known COVID contact.   She first noticed symptoms on June 16th. She was at work and noticed chest tightness. She fel anxious which made it worse. Her heart was racing and felt palpitations. She got married last year. 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 She had mirena and this was removed. She had bad reflux too which responded to PPI. She started to feel much better. She has notice low BP levels. About 2 weeks ago she felt like her breathing was trouble again. Not being able to have a satisfying full breath. She ultimately ended up in the ED. Her symptoms seen to be very episodic and random throughout the day.    Past Medical History:  Diagnosis Date  . Anemia   . History of recurrent UTI (urinary tract infection)   . Viral meningitis      Family History  Problem Relation Age of Onset  . Thyroid cancer Mother   . Thyroid nodules Mother   . Thyroid nodules Sister      Past Surgical History:  Procedure Laterality Date  . TOOTH EXTRACTION    . WISDOM TOOTH EXTRACTION      Social  History   Socioeconomic History  . Marital status: Single    Spouse name: Not on file  . Number of children: Not on file  . Years of education: Not on file  . Highest education level: Not on file  Occupational History  . Not on file  Social Needs  . Financial resource strain: Not on file  . Food insecurity    Worry: Not on file    Inability: Not on file  . Transportation needs    Medical: Not on file    Non-medical: Not on file  Tobacco Use  . Smoking status: Never Smoker  . Smokeless tobacco: Never Used  Substance and Sexual Activity  . Alcohol use: No  . Drug use: No  . Sexual activity: Yes  Lifestyle  . Physical activity    Days per week: Not on file    Minutes per session: Not on file  . Stress: Not on file  Relationships  . Social Musicianconnections    Talks on phone: Not on file    Gets together: Not on file    Attends religious service: Not on file    Active member of club or organization: Not on file    Attends meetings of clubs or organizations: Not on file    Relationship status: Not on file  . Intimate partner violence  Fear of current or ex partner: Not on file    Emotionally abused: Not on file    Physically abused: Not on file    Forced sexual activity: Not on file  Other Topics Concern  . Not on file  Social History Narrative  . Not on file     No Known Allergies   Outpatient Medications Prior to Visit  Medication Sig Dispense Refill  . Albuterol Sulfate (PROAIR RESPICLICK) 108 (90 Base) MCG/ACT AEPB Inhale 2 puffs into the lungs every 4 (four) hours as needed (cough and wheezing). 1 each 0  . b complex vitamins tablet Take 1 tablet by mouth daily.    . Loratadine (CLARITIN) 10 MG CAPS Take by mouth daily.    . NON FORMULARY Ensure daily    . propranolol (INDERAL) 10 MG tablet Take 1 tablet (10 mg total) by mouth every 12 (twelve) hours as needed (for anxiety). 15 tablet 1  . Cholecalciferol (VITAMIN D3) 1.25 MG (50000 UT) CAPS Take 1,000 Units by  mouth daily. 1000 units    . omeprazole (PRILOSEC) 40 MG capsule Take 1 capsule by mouth daily.    . Ferrous Sulfate (IRON PO) Take by mouth daily.    . Multiple Vitamin (MULTIVITAMIN WITH MINERALS) TABS tablet Take 1 tablet by mouth daily.     No facility-administered medications prior to visit.     Review of Systems  Constitutional: Negative for chills, fever, malaise/fatigue and weight loss.  HENT: Negative for hearing loss, sore throat and tinnitus.   Eyes: Negative for blurred vision and double vision.  Respiratory: Positive for shortness of breath. Negative for cough, hemoptysis, sputum production, wheezing and stridor.        Chest tightness  Cardiovascular: Negative for chest pain, palpitations, orthopnea, leg swelling and PND.  Gastrointestinal: Positive for heartburn. Negative for abdominal pain, constipation, diarrhea, nausea and vomiting.  Genitourinary: Negative for dysuria, hematuria and urgency.  Musculoskeletal: Negative for joint pain and myalgias.  Skin: Negative for itching and rash.  Neurological: Negative for dizziness, tingling, weakness and headaches.  Endo/Heme/Allergies: Negative for environmental allergies. Does not bruise/bleed easily.  Psychiatric/Behavioral: Negative for depression. The patient is not nervous/anxious and does not have insomnia.   All other systems reviewed and are negative.    Objective:  Physical Exam Vitals signs reviewed.  Constitutional:      General: She is not in acute distress.    Appearance: She is well-developed.     Comments: Thin, low BMI  HENT:     Head: Normocephalic and atraumatic.  Eyes:     General: No scleral icterus.    Conjunctiva/sclera: Conjunctivae normal.     Pupils: Pupils are equal, round, and reactive to light.  Neck:     Musculoskeletal: Neck supple.     Vascular: No JVD.     Trachea: No tracheal deviation.  Cardiovascular:     Rate and Rhythm: Normal rate and regular rhythm.     Heart sounds: Normal  heart sounds. No murmur.     Comments: Inspiratory splitting of S2 Pulmonary:     Effort: Pulmonary effort is normal. No tachypnea, accessory muscle usage or respiratory distress.     Breath sounds: Normal breath sounds. No stridor. No wheezing, rhonchi or rales.  Abdominal:     General: Bowel sounds are normal. There is no distension.     Palpations: Abdomen is soft.     Tenderness: There is no abdominal tenderness.  Musculoskeletal:  General: No tenderness.  Lymphadenopathy:     Cervical: No cervical adenopathy.  Skin:    General: Skin is warm and dry.     Capillary Refill: Capillary refill takes less than 2 seconds.     Findings: No rash.  Neurological:     Mental Status: She is alert and oriented to person, place, and time.  Psychiatric:        Behavior: Behavior normal.      Vitals:   01/25/19 1601  BP: 98/64  Pulse: 76  Temp: 98.7 F (37.1 C)  TempSrc: Temporal  SpO2: 98%  Weight: 91 lb 6.4 oz (41.5 kg)  Height: 5\' 2"  (1.575 m)   98% on RA BMI Readings from Last 3 Encounters:  01/25/19 16.72 kg/m  12/21/18 16.75 kg/m  03/31/12 16.83 kg/m   Wt Readings from Last 3 Encounters:  01/25/19 91 lb 6.4 oz (41.5 kg)  12/21/18 91 lb 9.6 oz (41.5 kg)  03/31/12 92 lb (41.7 kg)     CBC    Component Value Date/Time   WBC 9.8 11/16/2018 2157   RBC 4.95 11/16/2018 2157   HGB 11.9 (L) 11/16/2018 2157   HCT 38.2 11/16/2018 2157   PLT 287 11/16/2018 2157   MCV 77.2 (L) 11/16/2018 2157   MCH 24.0 (L) 11/16/2018 2157   MCHC 31.2 11/16/2018 2157   RDW 11.9 11/16/2018 2157   LYMPHSABS 1.8 04/29/2013 1512   MONOABS 0.6 04/29/2013 1512   EOSABS 0.2 04/29/2013 1512   BASOSABS 0.0 04/29/2013 1512     Chest Imaging: 12/07/2018, 01/12/2019 chest x-ray Evaluation for shortness of breath.  No acute cardiopulmonary process.  There was a density within the upper lobe which was followed up on repeat imaging likely related to vascular overlay The patient's images have  been independently reviewed by me.    Pulmonary Functions Testing Results: No flowsheet data found.  FeNO: None   Pathology: None  Echocardiogram: None   Heart Catheterization: None     Assessment & Plan:     ICD-10-CM   1. SOB (shortness of breath)  R06.02   2. Microcytic anemia  D50.9   3. Chest tightness  R07.89   4. Gastroesophageal reflux disease without esophagitis  K21.9     Discussion:  This is a 33 year old young female with a several week history of episodic shortness of breath chest tightness.  She also has complaints of gastroesophageal reflux disease that responded to treatment with PPI.  She is currently off of this and some of her symptoms have recurred.  As for the etiology of this recurrent episodes of chest tightness she has discussed potential etiology of anxiety with different providers.  She does not necessarily feel anxious but she could see how when she feels this way that it makes her anxiety symptoms worse.  Of note her husband recently quit his job she is working full-time he is in school and they have been married only a year.  With her discussing the situation at home she does seem a little bit stressed and has some pressured speech regarding this.  She does have some allergy symptoms that she has been treating with over-the-counter antihistamines.  We could very well be dealing with intermittent asthma/bronchial reactivity type symptoms.  She does feel better with the use of albuterol as well as the use of over-the-counter Primatene Mist.  Therefore I believe it is reasonable to start her on a maintenance inhaler regimen to see if her symptoms completely dissipate with adequate  treatment of asthma symptoms.  We will start her on Symbicort 80, 2 puffs twice a day as well as PRN albuterol for shortness of breath and wheezing.  If symptoms continue to decline and she continues to get worse I think we can need to consider further investigation to include  pulmonary function test as well as blood work for regional allergy panel, IgE and absolute eosinophil count.  Otherwise we can continue a more conservative evaluation.  Samples were given today in the office of inhalers and patient was instructed on how to use this.  She was also told to restart her PPI from over-the-counter to see if she can completely eliminate her reflux symptoms which may very well be exacerbating some of her airway symptoms.  Patient to return to clinic in 4 to 6 weeks to see how she is doing.  Greater than 50% of this patient's 45-minute of visit was spent face-to-face discussing the above recommendations and treatment plan   Current Outpatient Medications:  .  Albuterol Sulfate (PROAIR RESPICLICK) 108 (90 Base) MCG/ACT AEPB, Inhale 2 puffs into the lungs every 4 (four) hours as needed (cough and wheezing)., Disp: 1 each, Rfl: 0 .  b complex vitamins tablet, Take 1 tablet by mouth daily., Disp: , Rfl:  .  Loratadine (CLARITIN) 10 MG CAPS, Take by mouth daily., Disp: , Rfl:  .  NON FORMULARY, Ensure daily, Disp: , Rfl:  .  propranolol (INDERAL) 10 MG tablet, Take 1 tablet (10 mg total) by mouth every 12 (twelve) hours as needed (for anxiety)., Disp: 15 tablet, Rfl: 1 .  Cholecalciferol (VITAMIN D3) 1.25 MG (50000 UT) CAPS, Take 1,000 Units by mouth daily. 1000 units, Disp: , Rfl:  .  omeprazole (PRILOSEC) 40 MG capsule, Take 1 capsule by mouth daily., Disp: , Rfl:    Courtney IgoBradley L Lashanti Chambless, DO [facility] 01/25/2019 4:06 PM

## 2019-01-28 ENCOUNTER — Telehealth: Payer: Self-pay | Admitting: Pulmonary Disease

## 2019-01-28 NOTE — Telephone Encounter (Signed)
Called and spoke to patient. Patient stated the Symbicort was a new inhaler and that she wasn't sure if she was allowed to still use the Albuterol inhaler with the Symbicort.  I reviewed with her both inhalers and instructions. I also educated her on the importance of continuing her maintanace inhaler and using the rescue inhaler when she is needing it/as prescribed. Patient reported she was just having some mild shortness of breath and wanted to make sure she was doing the correct things. Advised patient if symptom doesn't improve by using the rescue inhaler to call back.  Nothing further needed at this time.

## 2019-01-31 ENCOUNTER — Telehealth: Payer: Self-pay | Admitting: Pulmonary Disease

## 2019-01-31 NOTE — Telephone Encounter (Signed)
Attemtped to return call to patient with recommendations by B. Volanda Napoleon, NP.  No answer. Left message to call us.

## 2019-01-31 NOTE — Telephone Encounter (Signed)
Call returned to patient, she reports she wanted to let BI know that she had an episode of SOB on Friday followed by fatigue. She states she did use her rescue inhaler which did help but she felt a sort of heaviness in her chest for the rest of the day. She reports she is using symbicort daily which she has not noticed much difference. I made her aware BI is not in clinic today but I would send the message to a provider to see if they had any further recommendations. She reports the rescue inhaler was helpful when she used it.   BW please advise of any further recommendations. (BI is not in clinic)

## 2019-01-31 NOTE — Telephone Encounter (Signed)
I would continue Symbicort 2 puffs twice daily as prescribed, I don't think it has been long enough to see full effects from trial inhaler. That is great that the rescue inhaler helps- she can use every 4-6 hours for shortness of breath. Continue antihistamine. If she has a cough or chest congestion advise she take mucinex. Some of this could be anxiety as well which she may want to follow-up with her PCP.

## 2019-02-01 NOTE — Telephone Encounter (Signed)
LMTCB x2 for pt 

## 2019-02-02 NOTE — Telephone Encounter (Signed)
Returned call to patient and reviewed recommendations as per E. Volanda Napoleon, NP.  Patient acknowledged understanding of information and states she will continue the symbicort and let us know if it is helping or not.  Advised we will send in prescription to pharmacy if inhaler is helping and to contact us if further questions or concerns. Patient has f/up appt. Nothing further needed.

## 2019-02-04 ENCOUNTER — Telehealth: Payer: Self-pay | Admitting: Pulmonary Disease

## 2019-02-04 MED ORDER — BUDESONIDE-FORMOTEROL FUMARATE 80-4.5 MCG/ACT IN AERO: 2.0000 | INHALATION_SPRAY | Freq: Two times a day (BID) | RESPIRATORY_TRACT | 11 refills | Status: DC

## 2019-02-04 NOTE — Telephone Encounter (Signed)
Rx has been sent in. Pt is aware. Nothing further was needed. 

## 2019-03-08 ENCOUNTER — Encounter: Payer: Self-pay | Admitting: Pulmonary Disease

## 2019-03-08 ENCOUNTER — Other Ambulatory Visit: Payer: Self-pay

## 2019-03-08 ENCOUNTER — Ambulatory Visit: Payer: BLUE CROSS/BLUE SHIELD | Admitting: Pulmonary Disease

## 2019-03-08 VITALS — BP 88/64 | HR 67 | Temp 98.5°F | Ht 63.0 in | Wt 92.8 lb

## 2019-03-08 DIAGNOSIS — R0602 Shortness of breath: Secondary | ICD-10-CM | POA: Diagnosis not present

## 2019-03-08 DIAGNOSIS — R0789 Other chest pain: Secondary | ICD-10-CM

## 2019-03-08 NOTE — Progress Notes (Signed)
Synopsis: Referred in August 2020 for shortness of breath by Lois Huxley, PA  Subjective:   PATIENT ID: Courtney [name] GENDER: female DOB: [birth year], MRN: [id number]  Chief Complaint  Patient presents with  . Follow-up    F/U re: SOB. She reports using the symbicort for about a month. She did not like the way it made her feel. She stopped using the symbicort.    33 year old female originally seen by primary care via telehealth video visit.  Per documentation has had a 4 to 5-week symptom onset of shortness of breath.  Intermittent in nature.  Seen in the ER for acute shortness of breath with follow-up.  She complains of intermittent associated chest tightness and a central burning in the chest.  Saw cardiology and was told that everything looked okay.  No known COVID contact.   She first noticed symptoms on June 16th. She was at work and noticed chest tightness. She fel anxious which made it worse. Her heart was racing and felt palpitations. She got married last year. 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 She had mirena and this was removed. She had bad reflux too which responded to PPI. She started to feel much better. She has notice low BP levels. About 2 weeks ago she felt like her breathing was trouble again. Not being able to have a satisfying full breath. She ultimately ended up in the ED. Her symptoms seen to be very episodic and random throughout the day.   OV 03/08/2019: Patient doing well since her last visit.  She has noticed a few episodes of chest tightness and shortness of breath.  Overall she feels like this is related to her anxiety still.  She is slowly improving.  She is still afraid that there could be something "wrong with her lungs" to offer some reassurance we talked about the utility of pulmonary function test today.  Overall she does not believe she needs to be on a maintenance inhaler.  I agree with this at this time.  She is using her albuterol as needed.  She does think  that the albuterol helps when she has these episodes.  Denies fevers chest pain, nausea vomiting diarrhea.   Past Medical History:  Diagnosis Date  . Anemia   . History of recurrent UTI (urinary tract infection)   . Viral meningitis      Family History  Problem Relation Age of Onset  . Thyroid cancer Mother   . Thyroid nodules Mother   . Thyroid nodules Sister      Past Surgical History:  Procedure Laterality Date  . TOOTH EXTRACTION    . WISDOM TOOTH EXTRACTION      Social History   Socioeconomic History  . Marital status: Single    Spouse name: Not on file  . Number of children: Not on file  . Years of education: Not on file  . Highest education level: Not on file  Occupational History  . Not on file  Social Needs  . Financial resource strain: Not on file  . Food insecurity    Worry: Not on file    Inability: Not on file  . Transportation needs    Medical: Not on file    Non-medical: Not on file  Tobacco Use  . Smoking status: Never Smoker  . Smokeless tobacco: Never Used  Substance and Sexual Activity  . Alcohol use: No  . Drug use: No  . Sexual activity: Yes  Lifestyle  .  Physical activity    Days per week: Not on file    Minutes per session: Not on file  . Stress: Not on file  Relationships  . Social Musician on phone: Not on file    Gets together: Not on file    Attends religious service: Not on file    Active member of club or organization: Not on file    Attends meetings of clubs or organizations: Not on file    Relationship status: Not on file  . Intimate partner violence    Fear of current or ex partner: Not on file    Emotionally abused: Not on file    Physically abused: Not on file    Forced sexual activity: Not on file  Other Topics Concern  . Not on file  Social History Narrative  . Not on file     No Known Allergies   Outpatient Medications Prior to Visit  Medication Sig Dispense Refill  . Albuterol Sulfate (PROAIR  RESPICLICK) 108 (90 Base) MCG/ACT AEPB Inhale 2 puffs into the lungs every 4 (four) hours as needed (cough and wheezing). 1 each 0  . b complex vitamins tablet Take 1 tablet by mouth daily.    . Cholecalciferol (VITAMIN D3) 1.25 MG (50000 UT) CAPS Take 1,000 Units by mouth daily. 1000 units    . Loratadine (CLARITIN) 10 MG CAPS Take by mouth daily.    . NON FORMULARY Ensure daily    . omeprazole (PRILOSEC) 40 MG capsule Take 1 capsule by mouth daily.    . propranolol (INDERAL) 10 MG tablet Take 1 tablet (10 mg total) by mouth every 12 (twelve) hours as needed (for anxiety). 15 tablet 1  . budesonide-formoterol (SYMBICORT) 80-4.5 MCG/ACT inhaler Inhale 2 puffs into the lungs every 12 (twelve) hours. (Patient not taking: Reported on 03/08/2019) 1 Inhaler 11   No facility-administered medications prior to visit.     Review of Systems  Constitutional: Negative for chills, fever, malaise/fatigue and weight loss.  HENT: Negative for hearing loss, sore throat and tinnitus.   Eyes: Negative for blurred vision and double vision.  Respiratory: Positive for shortness of breath. Negative for cough, hemoptysis, sputum production, wheezing and stridor.   Cardiovascular: Negative for chest pain, palpitations, orthopnea, leg swelling and PND.  Gastrointestinal: Negative for abdominal pain, constipation, diarrhea, heartburn, nausea and vomiting.  Genitourinary: Negative for dysuria, hematuria and urgency.  Musculoskeletal: Negative for joint pain and myalgias.  Skin: Negative for itching and rash.  Neurological: Negative for dizziness, tingling, weakness and headaches.  Endo/Heme/Allergies: Negative for environmental allergies. Does not bruise/bleed easily.  Psychiatric/Behavioral: Negative for depression. The patient is not nervous/anxious and does not have insomnia.   All other systems reviewed and are negative.    Objective:  Physical Exam Vitals signs reviewed.  Constitutional:      General: She  is not in acute distress.    Appearance: She is well-developed.     Comments: Thin, low body mass  HENT:     Head: Normocephalic and atraumatic.  Eyes:     General: No scleral icterus.    Conjunctiva/sclera: Conjunctivae normal.     Pupils: Pupils are equal, round, and reactive to light.  Neck:     Musculoskeletal: Neck supple.     Vascular: No JVD.     Trachea: No tracheal deviation.  Cardiovascular:     Rate and Rhythm: Normal rate and regular rhythm.     Heart sounds: Normal heart sounds.  No murmur.  Pulmonary:     Effort: Pulmonary effort is normal. No tachypnea, accessory muscle usage or respiratory distress.     Breath sounds: Normal breath sounds. No stridor. No wheezing, rhonchi or rales.  Abdominal:     General: Bowel sounds are normal. There is no distension.     Palpations: Abdomen is soft.     Tenderness: There is no abdominal tenderness.  Musculoskeletal:        General: No tenderness.  Lymphadenopathy:     Cervical: No cervical adenopathy.  Skin:    General: Skin is warm and dry.     Capillary Refill: Capillary refill takes less than 2 seconds.     Findings: No rash.  Neurological:     Mental Status: She is alert and oriented to person, place, and time.  Psychiatric:        Behavior: Behavior normal.      Vitals:   03/08/19 1344  BP: (!) 88/64  Pulse: 67  Temp: 98.5 F (36.9 C)  TempSrc: Temporal  SpO2: 99%  Weight: 92 lb 12.8 oz (42.1 kg)  Height: 5\' 3"  (1.6 m)   99% on RA BMI Readings from Last 3 Encounters:  03/08/19 16.44 kg/m  01/25/19 16.72 kg/m  12/21/18 16.75 kg/m   Wt Readings from Last 3 Encounters:  03/08/19 92 lb 12.8 oz (42.1 kg)  01/25/19 91 lb 6.4 oz (41.5 kg)  12/21/18 91 lb 9.6 oz (41.5 kg)     CBC    Component Value Date/Time   WBC 9.8 11/16/2018 2157   RBC 4.95 11/16/2018 2157   HGB 11.9 (L) 11/16/2018 2157   HCT 38.2 11/16/2018 2157   PLT 287 11/16/2018 2157   MCV 77.2 (L) 11/16/2018 2157   MCH 24.0 (L)  11/16/2018 2157   MCHC 31.2 11/16/2018 2157   RDW 11.9 11/16/2018 2157   LYMPHSABS 1.8 04/29/2013 1512   MONOABS 0.6 04/29/2013 1512   EOSABS 0.2 04/29/2013 1512   BASOSABS 0.0 04/29/2013 1512     Chest Imaging: 12/07/2018, 01/12/2019 chest x-ray Evaluation for shortness of breath.  No acute cardiopulmonary process.  There was a density within the upper lobe which was followed up on repeat imaging likely related to vascular overlay The patient's images have been independently reviewed by me.    Pulmonary Functions Testing Results: No flowsheet data found.  FeNO: None   Pathology: None  Echocardiogram: None   Heart Catheterization: None     Assessment & Plan:     ICD-10-CM   1. SOB (shortness of breath)  R06.02 Pulmonary Function Test  2. Chest tightness  R07.89    Discussion:  33 year old female several bouts of episodic shortness of breath and chest tightness.  This I believe by history and how fast the episodes dissipate are likely related to panic attacks and anxiety.  She may very well have some component of bronchospasm because she seems to respond to albuterol when the episodes linger.  Overall though she is doing much better with these episodes.  She is still concerned there could be something underlying with her lungs.   I think it is reasonable to have pulmonary function tests.  This will also give the patient some reassurance.  We discussed getting back into a regular physical exercise routine.  She was active quite often prior to these episodes now that she has noticed any kind of increase in shortness of breath.  She seems to be reassured with this information.  She plans to  try to get back to her previous state exercise tolerance.  Patient to follow-up in clinic in approximately 8 weeks with app after PFTs to review PFT results.  Greater than 50% of this patient's 15-minute office visit was been face-to-face discussing the above recommendations and treatment plan.    Current Outpatient Medications:  .  Albuterol Sulfate (PROAIR RESPICLICK) 108 (90 Base) MCG/ACT AEPB, Inhale 2 puffs into the lungs every 4 (four) hours as needed (cough and wheezing)., Disp: 1 each, Rfl: 0 .  b complex vitamins tablet, Take 1 tablet by mouth daily., Disp: , Rfl:  .  Cholecalciferol (VITAMIN D3) 1.25 MG (50000 UT) CAPS, Take 1,000 Units by mouth daily. 1000 units, Disp: , Rfl:  .  Loratadine (CLARITIN) 10 MG CAPS, Take by mouth daily., Disp: , Rfl:  .  NON FORMULARY, Ensure daily, Disp: , Rfl:  .  omeprazole (PRILOSEC) 40 MG capsule, Take 1 capsule by mouth daily., Disp: , Rfl:  .  propranolol (INDERAL) 10 MG tablet, Take 1 tablet (10 mg total) by mouth every 12 (twelve) hours as needed (for anxiety)., Disp: 15 tablet, Rfl: 1   Josephine Igo, DO [facility] 03/08/2019 2:14 PM

## 2019-03-08 NOTE — Patient Instructions (Addendum)
Thank you for visiting Dr. Valeta Harms at [facility]. Today we recommend the following:  Orders Placed This Encounter  Procedures  . Pulmonary Function Test   Return in about 8 weeks (around 05/03/2019) for with APP, Derl Barrow or Wyn Quaker.    Please do your part to reduce the spread of COVID-19.

## 2019-03-16 DIAGNOSIS — Z113 Encounter for screening for infections with a predominantly sexual mode of transmission: Secondary | ICD-10-CM | POA: Diagnosis not present

## 2019-03-16 DIAGNOSIS — Z3040 Encounter for surveillance of contraceptives, unspecified: Secondary | ICD-10-CM | POA: Diagnosis not present

## 2019-04-15 ENCOUNTER — Telehealth: Payer: Self-pay | Admitting: Cardiovascular Disease

## 2019-04-15 DIAGNOSIS — R5383 Other fatigue: Secondary | ICD-10-CM | POA: Diagnosis not present

## 2019-04-15 DIAGNOSIS — D649 Anemia, unspecified: Secondary | ICD-10-CM | POA: Diagnosis not present

## 2019-04-15 DIAGNOSIS — R531 Weakness: Secondary | ICD-10-CM | POA: Diagnosis not present

## 2019-04-15 DIAGNOSIS — R002 Palpitations: Secondary | ICD-10-CM | POA: Diagnosis not present

## 2019-04-15 NOTE — Telephone Encounter (Signed)
Spoke with patient and she has been having intermittent chest pain and shortness of breath since June. Yesterday she had a "weak spell" last night after having chest pains yesterday. Patient denies chest pains currently however she is concerned. Scheduled office visit/ekg for next week. Stated she had been to ED before and they do nothing.

## 2019-04-15 NOTE — Telephone Encounter (Signed)
New Message  Pt c/o of Chest Pain: STAT if CP now or developed within 24 hours  1. Are you having CP right now? Discomfort; soreness around upper chest; feels like a pinch  2. Are you experiencing any other symptoms (ex. SOB, nausea, vomiting, sweating)? No  3. How long have you been experiencing CP? Back in June and has subsided; started back yesterday around 7:30 pm  4. Is your CP continuous or coming and going? Comes on and off  5. Have you taken Nitroglycerin? No; drinking water and added liquid IV which helps her ?

## 2019-04-20 ENCOUNTER — Other Ambulatory Visit: Payer: Self-pay

## 2019-04-20 ENCOUNTER — Encounter: Payer: Self-pay | Admitting: Adult Health

## 2019-04-20 ENCOUNTER — Ambulatory Visit: Payer: BLUE CROSS/BLUE SHIELD | Admitting: Adult Health

## 2019-04-20 VITALS — BP 113/68 | HR 75 | Ht 63.0 in | Wt 93.8 lb

## 2019-04-20 DIAGNOSIS — R002 Palpitations: Secondary | ICD-10-CM | POA: Diagnosis not present

## 2019-04-20 DIAGNOSIS — R42 Dizziness and giddiness: Secondary | ICD-10-CM | POA: Diagnosis not present

## 2019-04-20 DIAGNOSIS — I519 Heart disease, unspecified: Secondary | ICD-10-CM

## 2019-04-20 DIAGNOSIS — F419 Anxiety disorder, unspecified: Secondary | ICD-10-CM | POA: Diagnosis not present

## 2019-04-20 NOTE — Patient Instructions (Signed)
Medication Instructions:  Continue current medications  *If you need a refill on your cardiac medications before your next appointment, please call your pharmacy*  Lab Work: None Ordered   Testing/Procedures: Your physician has recommended that you wear an event monitor for 30 days. Event monitors are medical devices that record the heart's electrical activity. Doctors most often [place] these monitors to diagnose arrhythmias. Arrhythmias are problems with the speed or rhythm of the heartbeat. The monitor is a small, portable device. You can wear one while you do your normal daily activities. This is usually used to diagnose what is causing palpitations/syncope (passing out).  Your physician has requested that you have an echocardiogram. Echocardiography is a painless test that uses sound waves to create images of your heart. It provides your doctor with information about the size and shape of your heart and how well your heart's chambers and valves are working. This procedure takes approximately one hour. There are no restrictions for this procedure.    Follow-Up: At [facility], you and your health needs are our priority.  As part of our continuing mission to provide you with exceptional heart care, we have created designated Provider Care Teams.  These Care Teams include your primary Cardiologist (physician) and Advanced Practice Providers (APPs -  Physician Assistants and Nurse Practitioners) who all work together to provide you with the care you need, when you need it.  Your next appointment:   2 month(s)  The format for your next appointment:   In Person  Provider:   Jory Sims, DNP, ANP

## 2019-04-20 NOTE — Progress Notes (Signed)
If nothing else turns out it might be worthwhile to do a tilt table test.  May be she is having aborted episodes of vasovagal syncope.  But I agree with you anxiety is the most likely problem.

## 2019-04-20 NOTE — Progress Notes (Signed)
Cardiology Office Note   Date:  04/20/2019   ID:  Courtney [name], DOB [birth year], MRN [id number]  PCP:  Wilfrid Lund, PA  Cardiologist: Dr.Croitoru     History of Present Illness: [name] is a 33 y.o. female who presents for ongoing assessment and management of palpitations, occasional chest tightness on and off and not exercise related.  The heart racing developed in parallel with severe menorrhagia and has spontaneously improved after removal of IUD.  It was felt that her chest pain was atypical and not suggestive of CAD especially with a very low risk profile.  Dr.Croitoru felt that her palpitations are most consistent with physiologic sinus tachycardia.  Other history includes iron deficiency anemia.  The patient called our office on 04/15/2019 with complaints of intermittent chest pain and shortness of breath since June 2020.  She also had what she describes a "weak spell" after having chest pressure.  She has been seen in the ED in the past for similar symptoms, and has been ruled out for ACS.  She refused to go to ED again and she felt they were doing nothing to help her.  She comes today very talkative complaining of feelings of near syncope, palpitations, flushing, which occur with and without exertion.  Her most recent episode was while she was sitting at the table eating dinner with friends.  She had a sudden feeling of near syncope, feeling hollow in her chest.  She is followed by pulmonary and is due to have PFTs.  She has been ruled out for anemia by her primary care physician with no other abnormalities.  Past Medical History:  Diagnosis Date  . Anemia   . History of recurrent UTI (urinary tract infection)   . Viral meningitis     Past Surgical History:  Procedure Laterality Date  . TOOTH EXTRACTION    . WISDOM TOOTH EXTRACTION       Current Outpatient Medications  Medication Sig Dispense Refill  . Cholecalciferol (VITAMIN D3) 1.25 MG (50000 UT) CAPS Take 1,000 Units  by mouth daily. 1000 units    . Loratadine (CLARITIN) 10 MG CAPS Take by mouth daily.     No current facility-administered medications for this visit.     Allergies:   Patient has no known allergies.    Social History:  The patient  reports that she has never smoked. She has never used smokeless tobacco. She reports that she does not drink alcohol or use drugs.   Family History:  The patient's family history includes Thyroid cancer in her mother; Thyroid nodules in her mother and sister.    ROS: All other systems are reviewed and negative. Unless otherwise mentioned in H&P    PHYSICAL EXAM: VS:  BP 113/68   Pulse 75   Ht 5\' 3"  (1.6 m)   Wt 93 lb 12.8 oz (42.5 kg)   SpO2 100%   BMI 16.62 kg/m  , BMI Body mass index is 16.62 kg/m. GEN: Well nourished, well developed, in no acute distress HEENT: normal Neck: no JVD, carotid bruits, or masses Cardiac: RRR; no murmurs, rubs, or gallops,no edema  Respiratory:  Clear to auscultation bilaterally, normal work of breathing GI: soft, nontender, nondistended, + BS MS: no deformity or atrophy Skin: warm and dry, no rash Neuro:  Strength and sensation are intact Psych: euthymic mood, full affect   EKG: Normal sinus rhythm heart rate of 75 bpm   Recent Labs: 11/16/2018: BUN 7; Creatinine, Ser 0.62; Hemoglobin 11.9;  Platelets 287; Potassium 3.5; Sodium 135    Lipid Panel No results found for: CHOL, TRIG, HDL, CHOLHDL, VLDL, LDLCALC, LDLDIRECT    Wt Readings from Last 3 Encounters:  04/20/19 93 lb 12.8 oz (42.5 kg)  03/08/19 92 lb 12.8 oz (42.1 kg)  01/25/19 91 lb 6.4 oz (41.5 kg)      Other studies Reviewed: None  ASSESSMENT AND PLAN:  1.  Frequent palpitations: Due to her ongoing complaints, I will place a 2-week ZIO cardiac monitor to evaluate frequency and morphology of her palpitations.  I have explained to her that if she has any feelings of near syncope or weakness she is to activate the monitor as well.  I will  also order an echocardiogram for evaluation of LV function, PFO, and valvular abnormalities to be thorough.  Exam did not reveal any heart murmurs or abnormal heart sounds.  2.  Anxiety: She does have a good bit of this which is expressed in her discussion of her symptoms.  She would like to rule out heart issues prior to proceeding with further evaluation for anxiety.  She states that she does not feel anxious when this occurs, but is afraid to exercise or do any other activity which may affect her heart in order to reduce anxiety until she is thoroughly evaluated.  Current medicines are reviewed at length with the patient today.    Labs/ tests ordered today include: Echocardiogram, 2-week cardiac monitor.  Phill Myron. [place] Pugh, ANP, AACC   04/20/2019 [facility] [place] [address] Office [phone] Fax [phone]  Notice: This dictation was prepared with Dragon dictation along with smaller phrase technology. Any transcriptional errors that result from this process are unintentional and may not be corrected upon review.

## 2019-05-04 ENCOUNTER — Ambulatory Visit: Payer: BLUE CROSS/BLUE SHIELD | Admitting: Pulmonary Disease

## 2019-05-04 ENCOUNTER — Telehealth: Payer: Self-pay | Admitting: *Deleted

## 2019-05-04 NOTE — Telephone Encounter (Signed)
[employer] to ship a 30 day cardiac event monitor to the patients home.  Patient enrolled 05/04/2019.  [employer] contact number [phone].

## 2019-05-04 NOTE — Telephone Encounter (Signed)
-----   Message from Trilby Drummer sent at 05/03/2019  9:29 AM EST ----- Regarding: Heart Monitor Patient is calling to check on the status of her monitor.

## 2019-05-06 ENCOUNTER — Other Ambulatory Visit (HOSPITAL_COMMUNITY): Payer: BLUE CROSS/BLUE SHIELD

## 2019-05-06 ENCOUNTER — Encounter (HOSPITAL_COMMUNITY): Payer: Self-pay | Admitting: Adult Health

## 2019-05-07 ENCOUNTER — Other Ambulatory Visit (HOSPITAL_COMMUNITY)
Admission: RE | Admit: 2019-05-07 | Discharge: 2019-05-07 | Disposition: A | Discharge status: Home / Self Care | Payer: BLUE CROSS/BLUE SHIELD | Source: Ambulatory Visit | Attending: Pulmonary Disease | Admitting: Pulmonary Disease

## 2019-05-07 DIAGNOSIS — Z20828 Contact with and (suspected) exposure to other viral communicable diseases: Secondary | ICD-10-CM | POA: Insufficient documentation

## 2019-05-07 DIAGNOSIS — Z01812 Encounter for preprocedural laboratory examination: Secondary | ICD-10-CM | POA: Diagnosis not present

## 2019-05-07 LAB — SARS CORONAVIRUS 2 (TAT 6-24 HRS): SARS Coronavirus 2: NEGATIVE

## 2019-05-09 NOTE — Telephone Encounter (Signed)
Follow up     Patient calling for status of monitor

## 2019-05-09 NOTE — Telephone Encounter (Signed)
[employer]  Tracking number [id number].  Monitor due to be delivered 05/10/19 before 9:00 PM.

## 2019-05-10 ENCOUNTER — Other Ambulatory Visit: Payer: Self-pay

## 2019-05-10 ENCOUNTER — Encounter: Payer: Self-pay | Admitting: Pulmonary Disease

## 2019-05-10 ENCOUNTER — Ambulatory Visit: Payer: BLUE CROSS/BLUE SHIELD | Admitting: Pulmonary Disease

## 2019-05-10 ENCOUNTER — Ambulatory Visit (INDEPENDENT_AMBULATORY_CARE_PROVIDER_SITE_OTHER): Payer: BLUE CROSS/BLUE SHIELD | Admitting: Pulmonary Disease

## 2019-05-10 VITALS — BP 102/70 | HR 95 | Temp 98.2°F | Ht 63.0 in | Wt 93.0 lb

## 2019-05-10 DIAGNOSIS — R0602 Shortness of breath: Secondary | ICD-10-CM | POA: Diagnosis not present

## 2019-05-10 DIAGNOSIS — R0789 Other chest pain: Secondary | ICD-10-CM

## 2019-05-10 DIAGNOSIS — K219 Gastro-esophageal reflux disease without esophagitis: Secondary | ICD-10-CM | POA: Diagnosis not present

## 2019-05-10 LAB — PULMONARY FUNCTION TEST
DL/VA % pred: 129 %
DL/VA: 6.2 ml/min/mmHg/L
DLCO unc % pred: 92 %
DLCO unc: 22.45 ml/min/mmHg
FEF 25-75 Post: 2.46 L/sec
FEF 25-75 Pre: 4.1 L/sec
FEF2575-%Change-Post: -39 %
FEF2575-%Pred-Post: 74 %
FEF2575-%Pred-Pre: 123 %
FEV1-%Change-Post: -9 %
FEV1-%Pred-Post: 78 %
FEV1-%Pred-Pre: 86 %
FEV1-Post: 2.33 L
FEV1-Pre: 2.57 L
FEV1FVC-%Change-Post: -15 %
FEV1FVC-%Pred-Pre: 114 %
FEV6-%Change-Post: 6 %
FEV6-Post: 2.81 L
FEV6-Pre: 2.63 L
FVC-%Change-Post: 6 %
FVC-%Pred-Post: 80 %
FVC-%Pred-Pre: 75 %
FVC-Post: 2.81 L
FVC-Pre: 2.63 L
Post FEV1/FVC ratio: 83 %
Post FEV6/FVC ratio: 100 %
Pre FEV1/FVC ratio: 98 %
Pre FEV6/FVC Ratio: 100 %
RV % pred: 123 %
RV: 1.75 L
TLC % pred: 82 %
TLC: 4.18 L

## 2019-05-10 NOTE — Patient Instructions (Addendum)
Thank you for visiting Dr. Baylynn Shifflett at [facility]. Today we recommend the following:  Return if symptoms worsen or fail to improve.    Please do your part to reduce the spread of COVID-19.  

## 2019-05-10 NOTE — Progress Notes (Signed)
Synopsis: Referred in August 2020 for shortness of breath by Lois Huxley, PA  Subjective:   PATIENT ID: Courtney [name] GENDER: female DOB: September 26, 1985, MRN: [id number]  Chief Complaint  Patient presents with  . Follow-up    F/u PFT . Patient is at her baseline.     33 year old female originally seen by primary care via telehealth video visit.  Per documentation has had a 4 to 5-week symptom onset of shortness of breath.  Intermittent in nature.  Seen in the ER for acute shortness of breath with follow-up.  She complains of intermittent associated chest tightness and a central burning in the chest.  Saw cardiology and was told that everything looked okay.  No known COVID contact.   She first noticed symptoms on June 16th. She was at work and noticed chest tightness. She fel anxious which made it worse. Her heart was racing and felt palpitations. She got married last year. 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 She had mirena and this was removed. She had bad reflux too which responded to PPI. She started to feel much better. She has notice low BP levels. About 2 weeks ago she felt like her breathing was trouble again. Not being able to have a satisfying full breath. She ultimately ended up in the ED. Her symptoms seen to be very episodic and random throughout the day.   OV 03/08/2019: Patient doing well since her last visit.  She has noticed a few episodes of chest tightness and shortness of breath.  Overall she feels like this is related to her anxiety still.  She is slowly improving.  She is still afraid that there could be something "wrong with her lungs" to offer some reassurance we talked about the utility of pulmonary function test today.  Overall she does not believe she needs to be on a maintenance inhaler.  I agree with this at this time.  She is using her albuterol as needed.  She does think that the albuterol helps when she has these episodes.  Denies fevers chest pain, nausea vomiting  diarrhea.  OV 05/10/2019: seen today following PFTs. She started dry brushing and this has been relaxing to her. Her breathing and fatigue is better. She still has occasional epigastric symptoms.  Patient here today for follow-up after PFTs.  PFTs revealed normal spirometry however predicted values unable to be given due to age and race per documentation.  Total lung capacity at 82% DLCO 100% however she does have a normal-appearing FEV1 and FVC at 2.8 L / 2.3 L.  Patient does feel as if her breathing symptoms have improved.  She has an ultrasound planned of her stomach per the patient ordered by her primary care provider.  Patient denies continued episodes of palpitations.   Past Medical History:  Diagnosis Date  . Anemia   . History of recurrent UTI (urinary tract infection)   . Viral meningitis      Family History  Problem Relation Age of Onset  . Thyroid cancer Mother   . Thyroid nodules Mother   . Thyroid nodules Sister      Past Surgical History:  Procedure Laterality Date  . TOOTH EXTRACTION    . WISDOM TOOTH EXTRACTION      Social History   Socioeconomic History  . Marital status: Single    Spouse name: Not on file  . Number of children: Not on file  . Years of education: Not on file  . Highest education  level: Not on file  Occupational History  . Not on file  Social Needs  . Financial resource strain: Not on file  . Food insecurity    Worry: Not on file    Inability: Not on file  . Transportation needs    Medical: Not on file    Non-medical: Not on file  Tobacco Use  . Smoking status: Never Smoker  . Smokeless tobacco: Never Used  Substance and Sexual Activity  . Alcohol use: No  . Drug use: No  . Sexual activity: Yes  Lifestyle  . Physical activity    Days per week: Not on file    Minutes per session: Not on file  . Stress: Not on file  Relationships  . Social Musicianconnections    Talks on phone: Not on file    Gets together: Not on file    Attends  religious service: Not on file    Active member of club or organization: Not on file    Attends meetings of clubs or organizations: Not on file    Relationship status: Not on file  . Intimate partner violence    Fear of current or ex partner: Not on file    Emotionally abused: Not on file    Physically abused: Not on file    Forced sexual activity: Not on file  Other Topics Concern  . Not on file  Social History Narrative  . Not on file     No Known Allergies   Outpatient Medications Prior to Visit  Medication Sig Dispense Refill  . Cholecalciferol (VITAMIN D3) 1.25 MG (50000 UT) CAPS Take 1,000 Units by mouth daily. 1000 units    . Loratadine (CLARITIN) 10 MG CAPS Take by mouth daily.     No facility-administered medications prior to visit.     Review of Systems  Constitutional: Negative for chills, fever, malaise/fatigue and weight loss.  HENT: Negative for hearing loss, sore throat and tinnitus.   Eyes: Negative for blurred vision and double vision.  Respiratory: Negative for cough, hemoptysis, sputum production, shortness of breath, wheezing and stridor.   Cardiovascular: Positive for palpitations. Negative for chest pain, orthopnea, leg swelling and PND.  Gastrointestinal: Positive for abdominal pain. Negative for constipation, diarrhea, heartburn, nausea and vomiting.       Epigastric pain  Genitourinary: Negative for dysuria, hematuria and urgency.  Musculoskeletal: Negative for joint pain and myalgias.  Skin: Negative for itching and rash.  Neurological: Negative for dizziness, tingling, weakness and headaches.  Endo/Heme/Allergies: Negative for environmental allergies. Does not bruise/bleed easily.  Psychiatric/Behavioral: Negative for depression. The patient is nervous/anxious. The patient does not have insomnia.   All other systems reviewed and are negative.    Objective:  Physical Exam Vitals signs reviewed.  Constitutional:      General: She is not in acute  distress.    Appearance: She is well-developed.     Comments: Low BMI  HENT:     Head: Normocephalic and atraumatic.  Eyes:     General: No scleral icterus.    Conjunctiva/sclera: Conjunctivae normal.     Pupils: Pupils are equal, round, and reactive to light.  Neck:     Musculoskeletal: Neck supple.     Vascular: No JVD.     Trachea: No tracheal deviation.  Cardiovascular:     Rate and Rhythm: Normal rate and regular rhythm.     Heart sounds: Normal heart sounds. No murmur.  Pulmonary:     Effort: Pulmonary effort is  normal. No tachypnea, accessory muscle usage or respiratory distress.     Breath sounds: Normal breath sounds. No stridor. No wheezing, rhonchi or rales.  Abdominal:     General: Bowel sounds are normal. There is no distension.     Palpations: Abdomen is soft.     Tenderness: There is no abdominal tenderness.  Musculoskeletal:        General: No tenderness.  Lymphadenopathy:     Cervical: No cervical adenopathy.  Skin:    General: Skin is warm and dry.     Capillary Refill: Capillary refill takes less than 2 seconds.     Findings: No rash.  Neurological:     Mental Status: She is alert and oriented to person, place, and time.  Psychiatric:        Behavior: Behavior normal.      Vitals:   05/10/19 1504  BP: 102/70  Pulse: 95  Temp: 98.2 F (36.8 C)  TempSrc: Temporal  SpO2: 99%  Weight: 93 lb (42.2 kg)  Height: 5\' 3"  (1.6 m)   99% on RA BMI Readings from Last 3 Encounters:  05/10/19 16.47 kg/m  04/20/19 16.62 kg/m  03/08/19 16.44 kg/m   Wt Readings from Last 3 Encounters:  05/10/19 93 lb (42.2 kg)  04/20/19 93 lb 12.8 oz (42.5 kg)  03/08/19 92 lb 12.8 oz (42.1 kg)     CBC    Component Value Date/Time   WBC 9.8 11/16/2018 2157   RBC 4.95 11/16/2018 2157   HGB 11.9 (L) 11/16/2018 2157   HCT 38.2 11/16/2018 2157   PLT 287 11/16/2018 2157   MCV 77.2 (L) 11/16/2018 2157   MCH 24.0 (L) 11/16/2018 2157   MCHC 31.2 11/16/2018 2157    RDW 11.9 11/16/2018 2157   LYMPHSABS 1.8 04/29/2013 1512   MONOABS 0.6 04/29/2013 1512   EOSABS 0.2 04/29/2013 1512   BASOSABS 0.0 04/29/2013 1512     Chest Imaging: 12/07/2018, 01/12/2019 chest x-ray Evaluation for shortness of breath.  No acute cardiopulmonary process.  There was a density within the upper lobe which was followed up on repeat imaging likely related to vascular overlay The patient's images have been independently reviewed by me.    Pulmonary Functions Testing Results: PFT Results Latest Ref Rng & Units 05/10/2019  FVC-Pre L 2.63  FVC-Post L 2.81  Pre FEV1/FVC % % 98  Post FEV1/FCV % % 83  FEV1-Pre L 2.57  FEV1-Post L 2.33  DLCO UNC% % 100  DLCO COR %Predicted % 135  TLC L 4.18  TLC % Predicted % 82  RV % Predicted % 123    FeNO: None   Pathology: None  Echocardiogram: None   Heart Catheterization: None     Assessment & Plan:     ICD-10-CM   1. SOB (shortness of breath)  R06.02   2. Chest tightness  R07.89   3. Gastroesophageal reflux disease without esophagitis  K21.9     Discussion:  This is a 33 year old female with several episodic periods of shortness of breath and chest tightness.  This is likely related to anxiety and panic attacks.  She feels much better after starting a dry brushing skin treatments which have been relaxing to her.  Her breathing is much better.  She has had reassuring pulmonary function tests.  Her symptoms have resolved without any further treatment for pulmonary disease.  Plan: We discussed all of this today in the office. I think she should return to clinic or give 35 a  call if symptoms return. Albuterol only caused her to have palpitations which made her panic attacks worse. She should continue to be mindful of treating her gastroesophageal reflux disease. Follow-up with primary care provider. Also encouraged her to get back to a regular exercise routine.  Greater than 50% of this patient's 15-minute office visit was  been face-to-face discussing above recommendations and treatment plan as well as review of pulmonary function test completed prior to today's office.   Current Outpatient Medications:  .  Cholecalciferol (VITAMIN D3) 1.25 MG (50000 UT) CAPS, Take 1,000 Units by mouth daily. 1000 units, Disp: , Rfl:  .  Loratadine (CLARITIN) 10 MG CAPS, Take by mouth daily., Disp: , Rfl:    Josephine Igo, DO [facility] 05/10/2019 3:12 PM

## 2019-05-10 NOTE — Progress Notes (Signed)
Full PFT performed today. °

## 2019-05-13 ENCOUNTER — Ambulatory Visit (INDEPENDENT_AMBULATORY_CARE_PROVIDER_SITE_OTHER): Payer: BLUE CROSS/BLUE SHIELD

## 2019-05-13 DIAGNOSIS — R42 Dizziness and giddiness: Secondary | ICD-10-CM | POA: Diagnosis not present

## 2019-05-13 DIAGNOSIS — R002 Palpitations: Secondary | ICD-10-CM | POA: Diagnosis not present

## 2019-05-20 ENCOUNTER — Other Ambulatory Visit: Payer: Self-pay

## 2019-05-20 ENCOUNTER — Ambulatory Visit (HOSPITAL_COMMUNITY): Payer: BLUE CROSS/BLUE SHIELD | Attending: Adult Health

## 2019-05-20 DIAGNOSIS — R002 Palpitations: Secondary | ICD-10-CM

## 2019-05-20 DIAGNOSIS — I519 Heart disease, unspecified: Secondary | ICD-10-CM | POA: Diagnosis not present

## 2019-05-20 DIAGNOSIS — R42 Dizziness and giddiness: Secondary | ICD-10-CM | POA: Diagnosis not present

## 2019-06-01 DIAGNOSIS — R7401 Elevation of levels of liver transaminase levels: Secondary | ICD-10-CM | POA: Diagnosis not present

## 2019-06-01 DIAGNOSIS — R002 Palpitations: Secondary | ICD-10-CM | POA: Diagnosis not present

## 2019-06-08 ENCOUNTER — Ambulatory Visit: Payer: BLUE CROSS/BLUE SHIELD | Admitting: Adult Health

## 2019-06-09 ENCOUNTER — Other Ambulatory Visit: Payer: BLUE CROSS/BLUE SHIELD

## 2019-06-14 DIAGNOSIS — Z20828 Contact with and (suspected) exposure to other viral communicable diseases: Secondary | ICD-10-CM | POA: Diagnosis not present

## 2019-06-18 NOTE — Progress Notes (Signed)
Cardiology Office Note   Date:  06/20/2019   ID:  Courtney [name], DOB 1986-03-18, MRN [id number]  PCP:  Wilfrid Lund, PA  Cardiologist:  Dr.Croitoru  No chief complaint on file.    History of Present Illness: Courtney [name] is a 34 y.o. female who presents for ongoing assessment and management of palpitations, occasional chest tightness on and off and not exercise related.  The heart racing developed in parallel with severe menorrhagia and has spontaneously improved after removal of IUD.  It was felt that her chest pain was atypical and not suggestive of CAD especially with a very low risk profile.  Dr.Croitoru felt that her palpitations are most consistent with physiologic sinus tachycardia.  Other history includes iron deficiency anemia.  She was last seen by me in the office on 04/20/2019 at which time I placed a 2-week ZIO cardiac monitor to evaluate frequency and morphology of her palpitations.  It was my opinion the majority of her symptoms were related to anxiety.   Dr. Herbie Baltimore Dr. Herbie Baltimore echocardiogram was completed on 05/23/2019, which revealed normal LV systolic function no LVH.  No valvular abnormalities.  She comes today with complaints of a rash on her arms, also on her sternal area where the cardiac monitor had peeled off and did remove a bit of her skin.  She states that it caused a good bit of itching and was started on Zyrtec by her PCP.Marland Kitchen  Cardiac monitor was reviewed which revealed normal sinus rhythm, artifact, with average heart rate of 76 bpm.  With no episodes of PSVT, PVCs, or PAT.  Past Medical History:  Diagnosis Date  . Anemia   . History of recurrent UTI (urinary tract infection)   . Viral meningitis     Past Surgical History:  Procedure Laterality Date  . TOOTH EXTRACTION    . WISDOM TOOTH EXTRACTION       Current Outpatient Medications  Medication Sig Dispense Refill  . cetirizine (ZYRTEC) 10 MG tablet Take 10 mg by mouth daily.    . Cholecalciferol (VITAMIN  D3) 1.25 MG (50000 UT) CAPS Take 1,000 Units by mouth daily. 1000 units    . Loratadine (CLARITIN) 10 MG CAPS Take by mouth daily.    . hydrocortisone 2.5 % ointment Apply topically 3 (three) times daily. 30 g 1   No current facility-administered medications for this visit.    Allergies:   Patient has no known allergies.    Social History:  The patient  reports that she has never smoked. She has never used smokeless tobacco. She reports that she does not drink alcohol or use drugs.   Family History:  The patient's family history includes Thyroid cancer in her mother; Thyroid nodules in her mother and sister.    ROS: All other systems are reviewed and negative. Unless otherwise mentioned in Courtney&P    PHYSICAL EXAM: VS:  BP 99/62   Pulse 71   Ht 5\' 3"  (1.6 m)   Wt 94 lb 3.2 oz (42.7 kg)   SpO2 98%   BMI 16.69 kg/m  , BMI Body mass index is 16.69 kg/m. GEN: Well nourished, well developed, in no acute distress HEENT: normal Neck: no JVD, carotid bruits, or masses Cardiac: RRR; no murmurs, rubs, or gallops,no edema  Respiratory:  Clear to auscultation bilaterally, normal work of breathing GI: soft, nontender, nondistended, + BS MS: no deformity or discoloration and erythema in the midsternal area (where cardiac monitor was located), some hives are noted  on her arms and abdomen, but scant amount. Neuro:  Strength and sensation are intact Psych: euthymic mood, full affect   EKG: Not completed this office visit  Recent Labs: 11/16/2018: BUN 7; Creatinine, Ser 0.62; Hemoglobin 11.9; Platelets 287; Potassium 3.5; Sodium 135    Lipid Panel No results found for: CHOL, TRIG, HDL, CHOLHDL, VLDL, LDLCALC, LDLDIRECT    Wt Readings from Last 3 Encounters:  06/20/19 94 lb 3.2 oz (42.7 kg)  05/10/19 93 lb (42.2 kg)  04/20/19 93 lb 12.8 oz (42.5 kg)      Other studies Reviewed: Echocardiogram 12-Jun-2019   1. Left ventricular ejection fraction, by visual estimation, is 55 to 60%. The  left ventricle has normal function. There is no left ventricular hypertrophy.  2. Global right ventricle has normal systolic function.The right ventricular size is normal.  3. Left atrial size was normal.  4. Right atrial size was normal.  5. The mitral valve is normal in structure. Trivial mitral valve regurgitation. No evidence of mitral stenosis.  6. The tricuspid valve is normal in structure. Tricuspid valve regurgitation is trivial.  7. The aortic valve is tricuspid. Aortic valve regurgitation is not visualized. No evidence of aortic valve sclerosis or stenosis.  8. The pulmonic valve was normal in structure. Pulmonic valve regurgitation is not visualized.  9. The inferior vena cava is normal in size with greater than 50% respiratory variability, suggesting right atrial pressure of 3 mmHg. 10. Normal LV function; no significant valvular heart disease.  ASSESSMENT AND PLAN:  1.  Palpitations: Review of cardiac monitor did not reveal rapid heart rhythm, PAT, SVT, or arrhythmias.  She is given reassurance.  She does have a bit of a rash on her arms and some skin discoloration and erythema where her cardiac monitor was affixed to the sternal area.  She has been scratching it a lot.  I will place hydrocortisone 2% ointment prescription for her so that she may use this for symptomatic relief.  Agree with use of antihistamine to help with symptoms of itching.  2.  Anxiety: She continues to express concern about having feelings of anxiety that "come out of nowhere" and anxiety about these episodes reoccurring.  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  I have advised that she may consider counseling, either speaking to a pastor, or a licensed psychotherapist to help with coping mechanism/tools that will help to relieve  some of her anxiety.  She verbalizes understanding.   Current medicines are reviewed at length with the patient today.    Labs/ tests ordered today include: None Courtney [name], ANP, AACC   06/20/2019 3:26 PM    [facility] [place] [address] Office [phone] Fax [phone]  Notice: This dictation was prepared with Dragon dictation along with smaller phrase technology. Any transcriptional errors that result from this process are unintentional and may not be corrected upon review.

## 2019-06-20 ENCOUNTER — Ambulatory Visit: Payer: BLUE CROSS/BLUE SHIELD | Admitting: Adult Health

## 2019-06-20 ENCOUNTER — Encounter: Payer: Self-pay | Admitting: Adult Health

## 2019-06-20 ENCOUNTER — Other Ambulatory Visit: Payer: Self-pay

## 2019-06-20 ENCOUNTER — Ambulatory Visit (INDEPENDENT_AMBULATORY_CARE_PROVIDER_SITE_OTHER): Payer: BLUE CROSS/BLUE SHIELD | Admitting: Adult Health

## 2019-06-20 VITALS — BP 99/62 | HR 71 | Ht 63.0 in | Wt 94.2 lb

## 2019-06-20 DIAGNOSIS — R002 Palpitations: Secondary | ICD-10-CM

## 2019-06-20 DIAGNOSIS — F418 Other specified anxiety disorders: Secondary | ICD-10-CM

## 2019-06-20 MED ORDER — HYDROCORTISONE 2.5 % EX OINT: TOPICAL_OINTMENT | Freq: Three times a day (TID) | CUTANEOUS | 1 refills | Status: DC

## 2019-06-20 NOTE — Patient Instructions (Addendum)
Medication Instructions:  Your physician has recommended you make the following change in your medication:   APPLY HYDROCORTISONE 2.5% OINTMENT TO AFFECTED SITE THREE TIMES A DAY  *If you need a refill on your cardiac medications before your next appointment, please call your pharmacy*  Lab Work: NONE If you have labs (blood work) drawn today and your tests are completely normal, you will receive your results only by: Marland Kitchen MyChart Message (if you have MyChart) OR . A paper copy in the mail If you have any lab test that is abnormal or we need to change your treatment, we will call you to review the results.  Testing/Procedures: NONE  Follow-Up: At [facility], you and your health needs are our priority.  As part of our continuing mission to provide you with exceptional heart care, we have created designated Provider Care Teams.  These Care Teams include your primary Cardiologist (physician) and Advanced Practice Providers (APPs -  Physician Assistants and Nurse Practitioners) who all work together to provide you with the care you need, when you need it.  Your next appointment:   6 month(s)  The format for your next appointment:   In Person  Provider:     Joni Reining, DNP, ANP

## 2019-06-20 NOTE — Progress Notes (Signed)
TY

## 2019-06-29 ENCOUNTER — Encounter: Payer: Self-pay | Admitting: *Deleted

## 2019-07-06 DIAGNOSIS — F419 Anxiety disorder, unspecified: Secondary | ICD-10-CM | POA: Diagnosis not present

## 2019-07-06 DIAGNOSIS — R7401 Elevation of levels of liver transaminase levels: Secondary | ICD-10-CM | POA: Diagnosis not present

## 2020-03-06 DIAGNOSIS — Z124 Encounter for screening for malignant neoplasm of cervix: Secondary | ICD-10-CM | POA: Diagnosis not present

## 2020-03-06 DIAGNOSIS — Z3040 Encounter for surveillance of contraceptives, unspecified: Secondary | ICD-10-CM | POA: Diagnosis not present

## 2020-03-06 DIAGNOSIS — Z01419 Encounter for gynecological examination (general) (routine) without abnormal findings: Secondary | ICD-10-CM | POA: Diagnosis not present

## 2020-03-06 DIAGNOSIS — F458 Other somatoform disorders: Secondary | ICD-10-CM | POA: Diagnosis not present

## 2020-08-16 DIAGNOSIS — Z23 Encounter for immunization: Secondary | ICD-10-CM | POA: Diagnosis not present

## 2020-08-21 DIAGNOSIS — Z00110 Health examination for newborn under 8 days old: Secondary | ICD-10-CM | POA: Diagnosis not present

## 2020-09-06 DIAGNOSIS — Z00111 Health examination for newborn 8 to 28 days old: Secondary | ICD-10-CM | POA: Diagnosis not present

## 2020-09-07 DIAGNOSIS — Z01118 Encounter for examination of ears and hearing with other abnormal findings: Secondary | ICD-10-CM | POA: Diagnosis not present

## 2020-12-13 DIAGNOSIS — Z13220 Encounter for screening for lipoid disorders: Secondary | ICD-10-CM | POA: Diagnosis not present

## 2020-12-13 DIAGNOSIS — Z8639 Personal history of other endocrine, nutritional and metabolic disease: Secondary | ICD-10-CM | POA: Diagnosis not present

## 2020-12-13 DIAGNOSIS — Z23 Encounter for immunization: Secondary | ICD-10-CM | POA: Diagnosis not present

## 2020-12-13 DIAGNOSIS — E611 Iron deficiency: Secondary | ICD-10-CM | POA: Diagnosis not present

## 2020-12-13 DIAGNOSIS — Z0000 Encounter for general adult medical examination without abnormal findings: Secondary | ICD-10-CM | POA: Diagnosis not present

## 2021-03-07 DIAGNOSIS — Z3040 Encounter for surveillance of contraceptives, unspecified: Secondary | ICD-10-CM | POA: Diagnosis not present

## 2021-03-07 DIAGNOSIS — Z01419 Encounter for gynecological examination (general) (routine) without abnormal findings: Secondary | ICD-10-CM | POA: Diagnosis not present

## 2021-10-22 DIAGNOSIS — S61212A Laceration without foreign body of right middle finger without damage to nail, initial encounter: Secondary | ICD-10-CM | POA: Diagnosis not present

## 2021-10-31 DIAGNOSIS — S61212D Laceration without foreign body of right middle finger without damage to nail, subsequent encounter: Secondary | ICD-10-CM | POA: Diagnosis not present

## 2021-10-31 DIAGNOSIS — Z4802 Encounter for removal of sutures: Secondary | ICD-10-CM | POA: Diagnosis not present

## 2022-06-02 NOTE — L&D Delivery Note (Signed)
Delivery Note At 10:07 PM Courtney viable female was delivered via Vaginal, Spontaneous (Presentation:      ).  APGAR: , ; weight  .   Placenta status: Spontaneous, Intact.  Cord:   with the following complications:  .  Cord pH: sent  Anesthesia:  IV fentanyl Episiotomy:  none Lacerations:  none Suture Repair:  none needed Est. Blood Loss (mL):  250 cc  Mom to  antepartum .  Baby to NICU  Mom progressed to complete plus 2 station.  She was given  magnesium bolus but still was unable to stop her labor.  Infant delivered and handed to the NICU team.  The infant did cry spontaneously.  He also maintained Courtney good heart rate.   Courtney [name] Courtney [name] 04/07/2023, 10:24 PM

## 2022-11-20 DIAGNOSIS — R110 Nausea: Secondary | ICD-10-CM | POA: Diagnosis not present

## 2022-11-20 DIAGNOSIS — Z3201 Encounter for pregnancy test, result positive: Secondary | ICD-10-CM | POA: Diagnosis not present

## 2022-11-20 DIAGNOSIS — Z3490 Encounter for supervision of normal pregnancy, unspecified, unspecified trimester: Secondary | ICD-10-CM | POA: Diagnosis not present

## 2022-11-22 ENCOUNTER — Other Ambulatory Visit (HOSPITAL_COMMUNITY)
Admission: AD | Admit: 2022-11-22 | Discharge: 2022-11-22 | Disposition: A | Discharge status: Home / Self Care | Payer: BC Managed Care – PPO | Attending: Obstetrics and Gynecology | Admitting: Obstetrics and Gynecology

## 2022-11-22 DIAGNOSIS — Z3490 Encounter for supervision of normal pregnancy, unspecified, unspecified trimester: Secondary | ICD-10-CM | POA: Insufficient documentation

## 2022-11-22 LAB — HCG, QUANTITATIVE, PREGNANCY: hCG, Beta Chain, Quant, S: 80931 m[IU]/mL — ABNORMAL HIGH (ref <5)

## 2022-12-11 DIAGNOSIS — O3680X9 Pregnancy with inconclusive fetal viability, other fetus: Secondary | ICD-10-CM | POA: Diagnosis not present

## 2022-12-11 DIAGNOSIS — Z3A01 Less than 8 weeks gestation of pregnancy: Secondary | ICD-10-CM | POA: Diagnosis not present

## 2023-01-01 DIAGNOSIS — Z362 Encounter for other antenatal screening follow-up: Secondary | ICD-10-CM | POA: Diagnosis not present

## 2023-01-01 DIAGNOSIS — Z3A11 11 weeks gestation of pregnancy: Secondary | ICD-10-CM | POA: Diagnosis not present

## 2023-01-01 LAB — OB RESULTS CONSOLE HIV ANTIBODY (ROUTINE TESTING): HIV: NONREACTIVE

## 2023-01-01 LAB — OB RESULTS CONSOLE HEPATITIS B SURFACE ANTIGEN: Hepatitis B Surface Ag: NEGATIVE

## 2023-01-01 LAB — OB RESULTS CONSOLE RPR: RPR: NONREACTIVE

## 2023-01-01 LAB — OB RESULTS CONSOLE RUBELLA ANTIBODY, IGM: Rubella: IMMUNE

## 2023-02-05 DIAGNOSIS — N390 Urinary tract infection, site not specified: Secondary | ICD-10-CM | POA: Diagnosis not present

## 2023-03-05 DIAGNOSIS — O26879 Cervical shortening, unspecified trimester: Secondary | ICD-10-CM | POA: Diagnosis not present

## 2023-03-05 DIAGNOSIS — Z363 Encounter for antenatal screening for malformations: Secondary | ICD-10-CM | POA: Diagnosis not present

## 2023-03-05 DIAGNOSIS — O09519 Supervision of elderly primigravida, unspecified trimester: Secondary | ICD-10-CM | POA: Diagnosis not present

## 2023-03-05 DIAGNOSIS — Z369 Encounter for antenatal screening, unspecified: Secondary | ICD-10-CM | POA: Diagnosis not present

## 2023-03-26 DIAGNOSIS — Z363 Encounter for antenatal screening for malformations: Secondary | ICD-10-CM | POA: Diagnosis not present

## 2023-03-26 DIAGNOSIS — Z3A23 23 weeks gestation of pregnancy: Secondary | ICD-10-CM | POA: Diagnosis not present

## 2023-04-07 ENCOUNTER — Encounter (HOSPITAL_COMMUNITY): Payer: Self-pay | Admitting: *Deleted

## 2023-04-07 ENCOUNTER — Inpatient Hospital Stay (HOSPITAL_COMMUNITY)
Admission: AD | Admit: 2023-04-07 | Discharge: 2023-04-09 | DRG: 806 | Disposition: A | Discharge status: Home / Self Care | Payer: BC Managed Care – PPO | Attending: Obstetrics and Gynecology | Admitting: Obstetrics and Gynecology

## 2023-04-07 DIAGNOSIS — O9081 Anemia of the puerperium: Secondary | ICD-10-CM | POA: Diagnosis not present

## 2023-04-07 DIAGNOSIS — Z87440 Personal history of urinary (tract) infections: Secondary | ICD-10-CM | POA: Diagnosis not present

## 2023-04-07 DIAGNOSIS — Z23 Encounter for immunization: Secondary | ICD-10-CM

## 2023-04-07 DIAGNOSIS — D62 Acute posthemorrhagic anemia: Secondary | ICD-10-CM | POA: Diagnosis not present

## 2023-04-07 DIAGNOSIS — R140 Abdominal distension (gaseous): Secondary | ICD-10-CM | POA: Diagnosis not present

## 2023-04-07 DIAGNOSIS — Z3A25 25 weeks gestation of pregnancy: Secondary | ICD-10-CM

## 2023-04-07 DIAGNOSIS — J810 Acute pulmonary edema: Secondary | ICD-10-CM | POA: Diagnosis not present

## 2023-04-07 DIAGNOSIS — O99824 Streptococcus B carrier state complicating childbirth: Secondary | ICD-10-CM | POA: Diagnosis present

## 2023-04-07 DIAGNOSIS — Z4682 Encounter for fitting and adjustment of non-vascular catheter: Secondary | ICD-10-CM | POA: Diagnosis not present

## 2023-04-07 DIAGNOSIS — O26893 Other specified pregnancy related conditions, third trimester: Secondary | ICD-10-CM | POA: Diagnosis not present

## 2023-04-07 DIAGNOSIS — Q039 Congenital hydrocephalus, unspecified: Secondary | ICD-10-CM | POA: Diagnosis not present

## 2023-04-07 DIAGNOSIS — Q250 Patent ductus arteriosus: Secondary | ICD-10-CM | POA: Diagnosis not present

## 2023-04-07 DIAGNOSIS — Z452 Encounter for adjustment and management of vascular access device: Secondary | ICD-10-CM | POA: Diagnosis not present

## 2023-04-07 LAB — CBC
HCT: 35.7 % — ABNORMAL LOW (ref 36.0–46.0)
Hemoglobin: 11.1 g/dL — ABNORMAL LOW (ref 12.0–15.0)
MCH: 24.4 pg — ABNORMAL LOW (ref 26.0–34.0)
MCHC: 31.1 g/dL (ref 30.0–36.0)
MCV: 78.6 fL — ABNORMAL LOW (ref 80.0–100.0)
Platelets: 268 10*3/uL (ref 150–400)
RBC: 4.54 MIL/uL (ref 3.87–5.11)
RDW: 12.8 % (ref 11.5–15.5)
WBC: 11.4 10*3/uL — ABNORMAL HIGH (ref 4.0–10.5)
nRBC: 0 % (ref 0.0–0.2)

## 2023-04-07 LAB — RAPID URINE DRUG SCREEN, HOSP PERFORMED
Amphetamines: NOT DETECTED
Barbiturates: NOT DETECTED
Benzodiazepines: NOT DETECTED
Cocaine: NOT DETECTED
Opiates: NOT DETECTED
Tetrahydrocannabinol: NOT DETECTED

## 2023-04-07 LAB — URINALYSIS, ROUTINE W REFLEX MICROSCOPIC
Bilirubin Urine: NEGATIVE
Glucose, UA: NEGATIVE mg/dL
Ketones, ur: NEGATIVE mg/dL
Nitrite: NEGATIVE
Protein, ur: NEGATIVE mg/dL
Specific Gravity, Urine: 1.004 — ABNORMAL LOW (ref 1.005–1.030)
pH: 7 (ref 5.0–8.0)

## 2023-04-07 LAB — OB RESULTS CONSOLE HIV ANTIBODY (ROUTINE TESTING): HIV: NONREACTIVE

## 2023-04-07 LAB — HIV ANTIBODY (ROUTINE TESTING W REFLEX): HIV Screen 4th Generation wRfx: NONREACTIVE

## 2023-04-07 MED ORDER — OXYCODONE-ACETAMINOPHEN 5-325 MG PO TABS: 2.0000 | ORAL_TABLET | ORAL | Status: DC | PRN

## 2023-04-07 MED ORDER — OXYTOCIN-SODIUM CHLORIDE 30-0.9 UT/500ML-% IV SOLN
2.5000 [IU]/h | INTRAVENOUS | Status: DC
  Filled 2023-04-07: qty 500

## 2023-04-07 MED ORDER — CALCIUM CARBONATE ANTACID 500 MG PO CHEW: 2.0000 | CHEWABLE_TABLET | ORAL | Status: DC | PRN

## 2023-04-07 MED ORDER — SODIUM CHLORIDE 0.9 % IV SOLN: 2.0000 g | Freq: Once | INTRAVENOUS | Status: DC

## 2023-04-07 MED ORDER — DEXTROSE 5 % IV SOLN: 500.0000 mg | Freq: Once | INTRAVENOUS | Status: DC

## 2023-04-07 MED ORDER — SODIUM CHLORIDE 0.9 % IV SOLN: 1.0000 g | INTRAVENOUS | Status: DC

## 2023-04-07 MED ORDER — LIDOCAINE HCL (PF) 1 % IJ SOLN: 30.0000 mL | INTRAMUSCULAR | Status: DC | PRN

## 2023-04-07 MED ORDER — LACTATED RINGERS IV SOLN: INTRAVENOUS | Status: DC

## 2023-04-07 MED ORDER — MAGNESIUM SULFATE 40 GM/1000ML IV SOLN
2.0000 g/h | INTRAVENOUS | Status: DC
Start: 2023-04-07 — End: 2023-04-08

## 2023-04-07 MED ORDER — MAGNESIUM SULFATE 40 GM/1000ML IV SOLN
2.0000 g/h | INTRAVENOUS | Status: DC
  Administered 2023-04-07: 2 g/h via INTRAVENOUS

## 2023-04-07 MED ORDER — MAGNESIUM SULFATE 40 GM/1000ML IV SOLN
INTRAVENOUS | Status: AC
  Administered 2023-04-07: 4 g via INTRAVENOUS
  Filled 2023-04-07: qty 1000

## 2023-04-07 MED ORDER — FENTANYL CITRATE (PF) 100 MCG/2ML IJ SOLN: 50.0000 ug | INTRAMUSCULAR | Status: DC | PRN

## 2023-04-07 MED ORDER — SODIUM CHLORIDE 0.9 % IV SOLN: 1.0000 g | Freq: Four times a day (QID) | INTRAVENOUS | Status: DC

## 2023-04-07 MED ORDER — MAGNESIUM SULFATE BOLUS VIA INFUSION
4.0000 g | Freq: Once | INTRAVENOUS | Status: AC
  Filled 2023-04-07: qty 1000

## 2023-04-07 MED ORDER — OXYTOCIN BOLUS FROM INFUSION
333.0000 mL | Freq: Once | INTRAVENOUS | Status: AC
  Administered 2023-04-07: 333 mL via INTRAVENOUS

## 2023-04-07 MED ORDER — LACTATED RINGERS IV SOLN: 500.0000 mL | INTRAVENOUS | Status: DC | PRN

## 2023-04-07 MED ORDER — ONDANSETRON HCL 4 MG/2ML IJ SOLN: 4.0000 mg | Freq: Four times a day (QID) | INTRAMUSCULAR | Status: DC | PRN

## 2023-04-07 MED ORDER — MAGNESIUM SULFATE BOLUS VIA INFUSION
4.0000 g | Freq: Once | INTRAVENOUS | Status: DC
  Filled 2023-04-07: qty 1000

## 2023-04-07 MED ORDER — BETAMETHASONE SOD PHOS & ACET 6 (3-3) MG/ML IJ SUSP
12.0000 mg | Freq: Once | INTRAMUSCULAR | Status: AC
  Administered 2023-04-07: 12 mg via INTRAMUSCULAR
  Filled 2023-04-07: qty 5

## 2023-04-07 MED ORDER — PRENATAL MULTIVITAMIN CH: 1.0000 | ORAL_TABLET | Freq: Every day | ORAL | Status: DC

## 2023-04-07 MED ORDER — BETAMETHASONE SOD PHOS & ACET 6 (3-3) MG/ML IJ SUSP: 12.0000 mg | INTRAMUSCULAR | Status: DC

## 2023-04-07 MED ORDER — SOD CITRATE-CITRIC ACID 500-334 MG/5ML PO SOLN: 30.0000 mL | ORAL | Status: DC | PRN

## 2023-04-07 MED ORDER — OXYCODONE-ACETAMINOPHEN 5-325 MG PO TABS: 1.0000 | ORAL_TABLET | ORAL | Status: DC | PRN

## 2023-04-07 MED ORDER — DOCUSATE SODIUM 100 MG PO CAPS: 100.0000 mg | ORAL_CAPSULE | Freq: Every day | ORAL | Status: DC

## 2023-04-07 MED ORDER — ACETAMINOPHEN 325 MG PO TABS: 650.0000 mg | ORAL_TABLET | ORAL | Status: DC | PRN

## 2023-04-07 NOTE — MAU Note (Signed)
.  Courtney [name] is a 37 y.o. at [redacted]w[redacted]d here in MAU reporting ctxs since this after with bloody mucous. Short cervix with this pregnancy. Preterm labor with first baby but delivered at 39wks  Onset of complaint: today Pain score: 6 Vitals:   04/07/23 2027 04/07/23 2029  BP:  110/66  Pulse: 84   Resp: 17   Temp: (!) 97.4 F (36.3 C)   SpO2: 100%      FHT:150 Lab orders placed from triage:   U/a

## 2023-04-07 NOTE — H&P (Signed)
Courtney [name] is [name] 37 y.o. female presenting for preterm labor.  She has had contractions increasing in intensity and frequency.    OB History     Gravida  3   Para  1   Term  1   Preterm      AB  1   Living  1      SAB  1   IAB      Ectopic      Multiple      Live Births  1          Past Medical History:  Diagnosis Date   Anemia    History of recurrent UTI (urinary tract infection)    Viral meningitis    Past Surgical History:  Procedure Laterality Date   TOOTH EXTRACTION     WISDOM TOOTH EXTRACTION     Family History: family history includes Thyroid cancer in her mother; Thyroid nodules in her mother and sister. Social History:  reports that she has never smoked. She has never used smokeless tobacco. She reports that she does not drink alcohol and does not use drugs.     Maternal Diabetes: No Genetic Screening: Normal Maternal Ultrasounds/Referrals: Normal Fetal Ultrasounds or other Referrals:  None Maternal Substance Abuse:  No Significant Maternal Medications:  None Significant Maternal Lab Results:  None Number of Prenatal Visits:greater than 3 verified prenatal visits Maternal Vaccinations:Flu Other Comments:  None  Review of Systems History Physical Examination: General appearance - alert, well appearing, and in no distress Chest - clear to auscultation, no wheezes, rales or rhonchi, symmetric air entry Heart - normal rate and regular rhythm Abdomen - soft, nontender, nondistended, no masses or organomegaly gravid Extremities - Homan's sign negative bilaterally  Dilation: 10 Effacement (%): 90 Station: Ballotable Exam by:: [employer], RN Blood pressure 115/68, pulse 92, temperature (!) 97.4 F (36.3 C), resp. rate 17, height 5\' 3"  (1.6 m), weight 51.3 kg, SpO2 100%. Exam Physical Exam  Prenatal labs: ABO, Rh: --/--/O POS (11/05 2051) Antibody: NEG (11/05 2051) Rubella:   RPR:    HBsAg:    HIV:    GBS:      Assessment/Plan: PTL Magnesium did not stop labor NICU aware Anticipate preterm birth Willl give abx   Courtney [name] Courtney [name] 04/07/2023, 9:55 PM

## 2023-04-07 NOTE — Progress Notes (Signed)
PT to [facility] via stretcher

## 2023-04-07 NOTE — MAU Provider Note (Addendum)
Chief Complaint:  Contractions and Vaginal Discharge   None    HPI: Courtney [name] is a 37 y.o. G3P1011 at 47w1dwho presents to maternity admissions reporting preterm uterine contractions since 1900hrs.  Has a history of preterm labor (term delivery) with prior pregnancy and short cervix this pregnancy. . She reports good fetal movement, denies LOF, vaginal bleeding,  urinary symptoms, n/v, diarrhea, or fever/chills.    Vaginal Discharge The patient's primary symptoms include pelvic pain and vaginal discharge. Associated symptoms include abdominal pain. Pertinent negatives include no chills or fever.     Past Medical History: Past Medical History:  Diagnosis Date   Anemia    History of recurrent UTI (urinary tract infection)    Viral meningitis     Past obstetric history: OB History  Gravida Para Term Preterm AB Living  3 1 1   1 1   SAB IAB Ectopic Multiple Live Births  1       1    # Outcome Date GA Lbr Len/2nd Weight Sex Type Anes PTL Lv  3 Current           2 Term 12/23/10 [redacted]w[redacted]d 07:34 / 00:28 3096 g F Vag-Spont None  LIV     Birth Comments: na  1 SAB 2006        DEC    Past Surgical History: Past Surgical History:  Procedure Laterality Date   TOOTH EXTRACTION     WISDOM TOOTH EXTRACTION      Family History: Family History  Problem Relation Age of Onset   Thyroid cancer Mother    Thyroid nodules Mother    Thyroid nodules Sister     Social History: Social History   Tobacco Use   Smoking status: Never   Smokeless tobacco: Never  Substance Use Topics   Alcohol use: No   Drug use: No    Allergies: No Known Allergies  Meds:  Medications Prior to Admission  Medication Sig Dispense Refill Last Dose   cetirizine (ZYRTEC) 10 MG tablet Take 10 mg by mouth daily.      Cholecalciferol (VITAMIN D3) 1.25 MG (50000 UT) CAPS Take 1,000 Units by mouth daily. 1000 units      hydrocortisone 2.5 % ointment Apply topically 3 (three) times daily. 30 g 1    Loratadine  (CLARITIN) 10 MG CAPS Take by mouth daily.       I have reviewed patient's Past Medical Hx, Surgical Hx, Family Hx, Social Hx, medications and allergies.   ROS:  Review of Systems  Constitutional:  Negative for chills and fever.  Respiratory:  Negative for shortness of breath.   Gastrointestinal:  Positive for abdominal pain.  Genitourinary:  Positive for pelvic pain and vaginal discharge. Negative for vaginal bleeding.   Other systems negative  Physical Exam  Patient Vitals for the past 24 hrs:  BP Temp Pulse Resp SpO2 Height Weight  04/07/23 2029 110/66 -- -- -- -- -- --  04/07/23 2027 -- (!) 97.4 F (36.3 C) 84 17 100 % 5\' 3"  (1.6 m) 51.3 kg   Constitutional: Well-developed, well-nourished female in no acute distress.  Cardiovascular: normal rate and rhythm Respiratory: normal effort, clear to auscultation bilaterally GI: Abd soft, non-tender, gravid appropriate for gestational age.   No rebound or guarding. MS: Extremities nontender, no edema, normal ROM Neurologic: Alert and oriented x 4.  GU: Neg CVAT.  PELVIC EXAM: ' Dilation: 10 Effacement (%): 90 Cervical Position: Anterior Station: Ballotable Presentation: Undeterminable Exam by:: Mayford Knife CNM  FHT:  Baseline 140 , moderate variability, accelerations present, no decelerations Contractions: q 2 mins Irregular    Labs: No results found for this or any previous visit (from the past 24 hour(s)).  Pending  Imaging:  No results found.  MAU Course/MDM: I have reviewed the triage vital signs and the nursing notes.   Pertinent labs & imaging results that were available during my care of the patient were reviewed by me and considered in my medical decision making (see chart for details).      I have reviewed her medical records including past results, notes and treatments.   I have ordered IV fluids and Magnesium Sulfate infusion.    NST reviewed Consult Dr Normand Sloop with presentation, exam findings and test  results. She will meet her in Labor and Delivery with Ultrasound  Treatments in MAU included EFM, IV.    Assessment: 1. Preterm labor in second trimester without delivery   2. [redacted] weeks gestation of pregnancy     Plan: Admit to Labor and Delivery Routine orders Magnesium Sulfate bolus and infusion Betamethasone MD to follow  Wynelle Bourgeois CNM, MSN Certified Nurse-Midwife 04/07/2023 8:55 PM

## 2023-04-08 ENCOUNTER — Encounter (HOSPITAL_COMMUNITY): Payer: Self-pay | Admitting: Obstetrics and Gynecology

## 2023-04-08 ENCOUNTER — Other Ambulatory Visit: Payer: Self-pay

## 2023-04-08 DIAGNOSIS — D62 Acute posthemorrhagic anemia: Secondary | ICD-10-CM | POA: Diagnosis not present

## 2023-04-08 LAB — RPR: RPR Ser Ql: NONREACTIVE

## 2023-04-08 LAB — CBC
HCT: 32.3 % — ABNORMAL LOW (ref 36.0–46.0)
Hemoglobin: 10.2 g/dL — ABNORMAL LOW (ref 12.0–15.0)
MCH: 24.2 pg — ABNORMAL LOW (ref 26.0–34.0)
MCHC: 31.6 g/dL (ref 30.0–36.0)
MCV: 76.5 fL — ABNORMAL LOW (ref 80.0–100.0)
Platelets: 268 10*3/uL (ref 150–400)
RBC: 4.22 MIL/uL (ref 3.87–5.11)
RDW: 12.8 % (ref 11.5–15.5)
WBC: 18.3 10*3/uL — ABNORMAL HIGH (ref 4.0–10.5)
nRBC: 0 % (ref 0.0–0.2)

## 2023-04-08 LAB — WET PREP, GENITAL
Clue Cells Wet Prep HPF POC: NONE SEEN
Sperm: NONE SEEN
Trich, Wet Prep: NONE SEEN
WBC, Wet Prep HPF POC: >=10 — AB (ref <10)
Yeast Wet Prep HPF POC: NONE SEEN

## 2023-04-08 LAB — TYPE AND SCREEN
ABO/RH(D): O POS
Antibody Screen: NEGATIVE

## 2023-04-08 MED ORDER — WITCH HAZEL-GLYCERIN EX PADS: 1.0000 | MEDICATED_PAD | CUTANEOUS | Status: DC | PRN

## 2023-04-08 MED ORDER — ACETAMINOPHEN 325 MG PO TABS: 650.0000 mg | ORAL_TABLET | ORAL | Status: DC | PRN

## 2023-04-08 MED ORDER — ZOLPIDEM TARTRATE 5 MG PO TABS: 5.0000 mg | ORAL_TABLET | Freq: Every evening | ORAL | Status: DC | PRN

## 2023-04-08 MED ORDER — LORATADINE 10 MG PO TABS
10.0000 mg | ORAL_TABLET | Freq: Every day | ORAL | Status: DC
  Administered 2023-04-08: 10 mg via ORAL
  Filled 2023-04-08: qty 1

## 2023-04-08 MED ORDER — SODIUM CHLORIDE 0.9% FLUSH
3.0000 mL | INTRAVENOUS | Status: DC | PRN
Start: 2023-04-08 — End: 2023-04-09

## 2023-04-08 MED ORDER — DIPHENHYDRAMINE HCL 25 MG PO CAPS: 25.0000 mg | ORAL_CAPSULE | Freq: Four times a day (QID) | ORAL | Status: DC | PRN

## 2023-04-08 MED ORDER — SODIUM CHLORIDE 0.9% FLUSH: 10.0000 mL | Freq: Two times a day (BID) | INTRAVENOUS | Status: DC

## 2023-04-08 MED ORDER — TETANUS-DIPHTH-ACELL PERTUSSIS 5-2.5-18.5 LF-MCG/0.5 IM SUSY
0.5000 mL | PREFILLED_SYRINGE | Freq: Once | INTRAMUSCULAR | Status: AC
  Administered 2023-04-09: 0.5 mL via INTRAMUSCULAR
  Filled 2023-04-08: qty 0.5

## 2023-04-08 MED ORDER — OXYTOCIN-SODIUM CHLORIDE 30-0.9 UT/500ML-% IV SOLN
2.5000 [IU]/h | INTRAVENOUS | Status: DC | PRN
Start: 2023-04-08 — End: 2023-04-09

## 2023-04-08 MED ORDER — ONDANSETRON HCL 4 MG PO TABS: 4.0000 mg | ORAL_TABLET | ORAL | Status: DC | PRN

## 2023-04-08 MED ORDER — MEASLES, MUMPS & RUBELLA VAC IJ SOLR: 0.5000 mL | Freq: Once | INTRAMUSCULAR | Status: DC

## 2023-04-08 MED ORDER — DIBUCAINE (PERIANAL) 1 % EX OINT: 1.0000 | TOPICAL_OINTMENT | CUTANEOUS | Status: DC | PRN

## 2023-04-08 MED ORDER — VITAMIN D3 25 MCG (1000 UNIT) PO TABS
1000.0000 [IU] | ORAL_TABLET | Freq: Every day | ORAL | Status: DC
Start: 2023-04-08 — End: 2023-04-09
  Administered 2023-04-08 – 2023-04-09 (×2): 1000 [IU] via ORAL
  Filled 2023-04-08 (×2): qty 1

## 2023-04-08 MED ORDER — IBUPROFEN 600 MG PO TABS: 600.0000 mg | ORAL_TABLET | Freq: Four times a day (QID) | ORAL | 0 refills | Status: AC

## 2023-04-08 MED ORDER — SODIUM CHLORIDE 0.9% FLUSH
3.0000 mL | Freq: Two times a day (BID) | INTRAVENOUS | Status: DC
  Administered 2023-04-08 (×2): 3 mL via INTRAVENOUS

## 2023-04-08 MED ORDER — COCONUT OIL OIL
1.0000 | TOPICAL_OIL | Status: DC | PRN
  Administered 2023-04-08: 1 via TOPICAL

## 2023-04-08 MED ORDER — SENNOSIDES-DOCUSATE SODIUM 8.6-50 MG PO TABS
2.0000 | ORAL_TABLET | Freq: Every day | ORAL | Status: DC
  Administered 2023-04-08 – 2023-04-09 (×2): 2 via ORAL
  Filled 2023-04-08 (×2): qty 2

## 2023-04-08 MED ORDER — PRENATAL MULTIVITAMIN CH
1.0000 | ORAL_TABLET | Freq: Every day | ORAL | Status: DC
  Administered 2023-04-08: 1 via ORAL
  Filled 2023-04-08: qty 1

## 2023-04-08 MED ORDER — SIMETHICONE 80 MG PO CHEW: 80.0000 mg | CHEWABLE_TABLET | ORAL | Status: DC | PRN

## 2023-04-08 MED ORDER — IBUPROFEN 600 MG PO TABS
600.0000 mg | ORAL_TABLET | Freq: Four times a day (QID) | ORAL | Status: DC
  Administered 2023-04-08 (×2): 600 mg via ORAL
  Filled 2023-04-08 (×3): qty 1

## 2023-04-08 MED ORDER — BENZOCAINE-MENTHOL 20-0.5 % EX AERO: 1.0000 | INHALATION_SPRAY | CUTANEOUS | Status: DC | PRN

## 2023-04-08 MED ORDER — ONDANSETRON HCL 4 MG/2ML IJ SOLN: 4.0000 mg | INTRAMUSCULAR | Status: DC | PRN

## 2023-04-08 NOTE — Discharge Summary (Signed)
SVD OB Discharge Summary       Patient Name: Courtney [name] [facility] DOB: June 15, 1985 MRN: [id number]  Date of admission: 04/07/2023 Delivering MD: Jaymes Graff Date of delivery: 04/07/2023 Type of delivery: SVD  Newborn Data: Sex: Baby female Circumcision: Currently in NICU was desire in pt circ prior to discharge Live born female  Birth Weight: 1 lb 13.3 oz (830 g) APGAR: 4, 8  Newborn Delivery   Birth date/time: 04/07/2023 22:07:47 Delivery type: Vaginal, Spontaneous     Feeding: breast/pumping  Infant will remain in NICU in stable condition.   Admitting diagnosis: Preterm labor [O60.00] Intrauterine pregnancy: [redacted]w[redacted]d     Secondary diagnosis:  Principal Problem:   Preterm labor Active Problems:   Preterm delivery   Normal postpartum course   Acute blood loss anemia                                Complications: None                                                              Intrapartum Procedures: spontaneous vaginal delivery and GBS unknwon Postpartum Procedures: none Complications-Operative and Postpartum: none Augmentation: N/A   History of Present Illness: [name] is a 37 y.o. female, [id number], who presents at [redacted]w[redacted]d weeks gestation. The patient has been followed at  [facility]  Her pregnancy has been complicated by:  Patient Active Problem List   Diagnosis Date Noted   Preterm delivery 04/08/2023   Normal postpartum course 04/08/2023   Acute blood loss anemia 04/08/2023   Preterm labor 04/07/2023   Contraception, device intrauterine-Mirena inserted 02/23/12 02/23/2012     Active Ambulatory Problems    Diagnosis Date Noted   Contraception, device intrauterine-Mirena inserted 02/23/12 02/23/2012   Resolved Ambulatory Problems    Diagnosis Date Noted   Pregnancy as incidental finding 12/25/2010   Past Medical History:  Diagnosis Date   Anemia    History of recurrent UTI (urinary tract infection)    Viral meningitis       Hospital course:  Onset of Labor With Vaginal Delivery      37 y.o. yo [id number] at [redacted]w[redacted]d was admitted in Active Labor on 04/07/2023. Labor course was complicated bypt was admitted in pre term labor with precipitous delivery on 11/5 at 25.[redacted] weeks pregnant, USD was neg, GC/C/Urine culture all collect PP and results still pending, pt had wet prep PP showing increased WBC and GBS was positive pt received x 1 dose BMZ and magnesium bolus. Pt has SVD which was precipitous on 11/5 over intact perineum EBL was , hgb drop of 11.3-8.7 on po iron and asymptomatic.   Membrane Rupture Time/Date: 10:05 PM,04/07/2023  Delivery Method:Vaginal, Spontaneous Operative Delivery:N/A Episiotomy: None Lacerations:  None Patient had a postpartum course complicated by ABLA po iron and asymptomatic. Marland Kitchen  She is ambulating, tolerating a regular diet, passing flatus, and urinating well. Patient is discharged home in stable condition on 04/09/23.  Newborn Data: Birth date:04/07/2023 Birth time:10:07 PM Gender:Female Living status:Living Apgars:4 ,8  Weight:830 g Postpartum Day # 2 : Patient up ad lib, denies syncope or dizziness. Reports consuming regular diet without issues and denies N/V. Patient reports 0  bowel movement + passing flatus.  Denies issues with urination and reports bleeding is "lighter."  Patient is PUmpfeeding and reports going well.  Desires undecided for postpartum contraception.  Pain is being appropriately managed with use of po meds.   Physical exam  Vitals:   04/08/23 0805 04/08/23 1218 04/08/23 1552 04/08/23 1933  BP: (!) 91/49 (!) 102/51 (!) 101/54 (!) 98/55  Pulse: 75 71 75 80  Resp: 16 16 16 17   Temp: 98.1 F (36.7 C) 98.2 F (36.8 C) 98.4 F (36.9 C) 98.3 F (36.8 C)  TempSrc: Oral Oral Oral Oral  SpO2: 97% 98% 98% 99%  Weight:      Height:       General: alert, cooperative, and no distress Lochia: appropriate Uterine Fundus: firm Perineum: INtact DVT Evaluation: No evidence  of DVT seen on physical exam. Negative Homan's sign. No cords or calf tenderness. No significant calf/ankle edema.  Labs: Lab Results  Component Value Date   WBC 18.3 (H) 04/08/2023   HGB 10.2 (L) 04/08/2023   HCT 32.3 (L) 04/08/2023   MCV 76.5 (L) 04/08/2023   PLT 268 04/08/2023      Latest Ref Rng & Units 11/16/2018    9:57 PM  CMP  Glucose 70 - 99 mg/dL 782   BUN 6 - 20 mg/dL 7   Creatinine 9.56 - 2.13 mg/dL 0.86   Sodium 578 - 469 mmol/L 135   Potassium 3.5 - 5.1 mmol/L 3.5   Chloride 98 - 111 mmol/L 101   CO2 22 - 32 mmol/L 23   Calcium 8.9 - 10.3 mg/dL 9.7     Date of discharge: 04/09/2023 Discharge Diagnoses: Premature labor and Preterm delivery  Discharge instruction: per After Visit Summary and "Baby and Me Booklet".  After visit meds:   Activity:           unrestricted and pelvic rest Advance as tolerated. Pelvic rest for 6 weeks.  Diet:                routine Medications: PNV, Ibuprofen, Colace, and Iron Postpartum contraception: Undecided Condition:  Pt discharge to home with baby in stable condition  Meds: Allergies as of 04/09/2023   No Known Allergies      Medication List     STOP taking these medications    cetirizine 10 MG tablet Commonly known as: ZYRTEC   Claritin 10 MG Caps Generic drug: Loratadine   hydrocortisone 2.5 % ointment       TAKE these medications    ibuprofen 600 MG tablet Commonly known as: ADVIL Take 1 tablet (600 mg total) by mouth every 6 (six) hours.   Vitamin D3 1.25 MG (50000 UT) Caps Take 1,000 Units by mouth daily. 1000 units        Discharge Follow Up:   Follow-up Information     [facility]. Schedule an appointment as soon as possible for a visit in 6 week(s).   Specialty: Obstetrics and Gynecology Contact information: [address] [place] [address] [place] [address] [phone]                 [facility] CNM, FNP-C, PMHNP-BC  [id number]  [place], [place] [address]  Cell: [phone]  Office Phone: [phone] Fax: [phone] 04/09/2023  7:19 AM

## 2023-04-08 NOTE — Progress Notes (Signed)
Post Partum Day 1 Subjective: no complaints, up ad lib, voiding, tolerating PO, and + flatus  Objective: Blood pressure (!) 101/54, pulse 75, temperature 98.4 F (36.9 C), temperature source Oral, resp. rate 16, height 5\' 3"  (1.6 m), weight 51.3 kg, SpO2 98%, unknown if currently breastfeeding.  Physical Exam:  General: alert and no distress Lochia: appropriate Uterine Fundus: firm Incision: n/a DVT Evaluation: no calf tenderness  Recent Labs    04/07/23 2051 04/08/23 0501  HGB 11.1* 10.2*  HCT 35.7* 32.3*    Assessment/Plan: Plan for discharge tomorrow Baby in NICU Cont routine PP care   LOS: 1 day   Purcell Nails, MD 04/08/2023, 6:52 PM

## 2023-04-08 NOTE — Clinical Social Work Maternal (Signed)
CLINICAL SOCIAL WORK MATERNAL/CHILD NOTE  Patient Details  Name: Kerby Borner MRN: [id number] Date of Birth:   Date:  December 11, 2021  Clinical Social Worker Initiating Note:  Vivi Barrack Date/Time: Initiated:  04/08/23/1535     Child's Name:  Lubertha South Niebrit   Biological Parents:  Mother, Father (MOB: H Din Knotts  FOB: Alycia Patten Niebrit 04/27/1986)   Need for Interpreter:  None   Reason for Referral:  Other (Comment) (NICU admission)   Address:  [address] [place] [address]    Phone number:  [phone] (home)     Additional phone number:   Household Members/Support Persons (HM/SP):   Household Member/Support Person 1, Household Member/Support Person 2   HM/SP Name Relationship DOB or Age  HM/SP -1 Sigurd Sos Spouse 04/27/1986  HM/SP -2 [name] Niebrit Daughter 12/22/2000  HM/SP -3        HM/SP -4        HM/SP -5        HM/SP -6        HM/SP -7        HM/SP -8          Natural Supports (not living in the home):  Parent, Other (Comment) (Sister)   Professional Supports: None   Employment: Environmental education officer   Type of Work: Pharmacist, hospital:  Engineer, maintenance (IT)   Homebound arranged:    Surveyor, quantity Resources:  Media planner    Other Resources:      Cultural/Religious Considerations Which May Impact Care:    Strengths:  Ability to meet basic needs  , Home prepared for child     Psychotropic Medications:         Pediatrician:       Pediatrician List:   [facility] [place]    [place]    [facility]      Pediatrician Fax Number:    Risk Factors/Current Problems:  None   Cognitive State:  Able to Concentrate  , Insightful  , Goal Oriented     Mood/Affect:  Comfortable  , Calm     CSW Assessment: CSW received consult for NICU admission. CSW met with MOB to offer support and complete assessment.    CSW met with MOB at the bedside and introduced CSW  role. CSW observed MOB lying in bed and her daughter Lonell Grandchild) present at bedside. MOB presented pleasant and was receptive to CSW visit. MOB gave CSW permission to share all information with her daughter present. CSW asked MOB how she has been doing. MOB reported that she has been doing alright. CSW offered to check in weekly with MOB to offer support and resources while infant remains in NICU.MOB was receptive to CSW weekly visit. CSW discussed SSI and provided resources for MOB to call to determine if the infant will qualify for the benefits. MOB shared with CSW that she was lying in bed worried about losing her private insurance because she does not plan to go back to work while the infant remains in the NICU. She reported that she would like to apply for Medicaid for the infant. CSW offered to have [place] financial counselor contact MOB to offer support when she ready. MOB reported that she would let CSW know when she was ready to apply for Medicaid. MOB inquired about WIC/FS. CSW provided MOB with resources to follow up. CSW asked MOB about her supports. MOB  identified her spouse, sister and mom as supports.   CSW asked MOB if she had mental health history. MOB shared that she had a panic attack in the year 2020 and was diagnosed with anxiety. MOB reported that it was "anxiety of the body not the mind" because she was not taking care of herself like she should. MOB reported she has not had concerns with anxiety since that year. CSW provided education regarding the baby blues period vs. perinatal mood disorders, discussed treatment and gave resources for mental health follow up if concerns arise.  CSW recommended MOB complete a self-evaluation during the postpartum time period using the New Mom Checklist from Postpartum Progress and encouraged MOB to contact a medical professional if symptoms are noted at any time. CSW assessed MOB for safety. MOB denied SI/HI.   CSW asked if MOB will be able to  get essential items for the infant. MOB reported that she will be able to get essential items for the infant.   CSW identifies no barriers to discharge at this time.  CSW will continue to offer support and resources to family while infant remains in NICU.  CSW Plan/Description:  Perinatal Mood and Anxiety Disorder (PMADs) Education, No Further Intervention Required/No Barriers to Discharge, Psychosocial Support and Ongoing Assessment of Needs, Supplemental Security Income (SSI) Information, Other Information/Referral to [employer], LCSW 06/08/22, 4:22 PM

## 2023-04-08 NOTE — Lactation Note (Signed)
This note was copied from a baby's chart.  NICU Lactation Consultation Note  Patient Name: Courtney [name] Matheney [id number] Date: 04/08/2023 Age:36 hours  Reason for consult: Initial assessment; NICU baby; Preterm <34wks; Infant < 6lbs; Other (Comment) (AMA)  SUBJECTIVE  LC in to visit with P2 Mom of [redacted]w[redacted]d infant in the NICU.  Mom pumped 1 1/2 hrs ago for first time.  LC offered to demonstrated hand expression and assist her to pump using a pumping band and check the flange size.  LC provided Mom with a small band and size 18 mm flanges.  Mom explained how the colostrum protector works and how she can bring the protector up to NICU for oral swabbing.  Device needs to be discarded after 24 hrs.  Mom collected a tiny drop.  Mom will pump again before 3 hrs and take the containers to baby at next visit.  She can wash and use until this time tomorrow.  Mom had a great experience breastfeeding her now 37 yr old for a year, she had a great supply of milk.  Reassured her that if she consistently pump, she can sustain a full supply.   LC provided Mom with a Pumping Log and entered the first 2 pumpings in the book for Mom.  Plan recommended- 1- STS with baby when able to 2- Breast massage and hand expression before and after double pumping 3- Pump both breasts on initiation setting 8 times per 24 hrs is goal.  Once volume exceeds 20 ml, Mom to change to maintain mode.  OBJECTIVE Infant data: Mother's Current Feeding Choice: Breast Milk and Donor Milk  O2 Device: Jet Vent FiO2 (%): 23 %  Infant feeding assessment No data recorded  Maternal data: [id number] Vaginal, Spontaneous Has patient been taught Hand Expression?: Yes Hand Expression Comments: glistening noted on nipples Significant Breast History:: ++ breast changes Current breast feeding challenges:: Infant delivered at [redacted]w[redacted]d in NICU Does the patient have breastfeeding experience prior to this delivery?: Yes How long did the patient  breastfeed?: 1 year Pumping frequency: Initiated pumping at 11 hrs post partum Pumped volume: 0 mL (drops) Flange Size: 18 Hands-free pumping top sizes: Small/Medium (Blue) Risk factor for low/delayed milk supply:: infant separation  Pump:  (Mom was given a pump, not sure what type.  Offered to submit a STORK pump request,  will wait until tomorrow after Mom figures out what pump she has)  Feeding Status: NPO  Maternal: Milk volume: Normal  INTERVENTIONS/PLAN Interventions: Interventions: Breast feeding basics reviewed; Skin to skin; Breast massage; Hand express; DEBP; Education; [employer] brochure; CDC Guidelines for Breast Pump Cleaning; Guidelines for Milk Supply and Pumping Schedule Handout Tools: Pump; Flanges; Hands-free pumping top Pump Education: Setup, frequency, and cleaning; Milk Storage  Plan: Consult Status: NICU follow-up NICU Follow-up type: New admission follow up   Judee Clara 04/08/2023, 1:49 PM

## 2023-04-09 LAB — GC/CHLAMYDIA PROBE AMP (~~LOC~~) NOT AT ARMC
Chlamydia: NEGATIVE
Chlamydia: NEGATIVE
Comment: NEGATIVE
Comment: NORMAL
Comment: NEGATIVE
Comment: NORMAL
Neisseria Gonorrhea: NEGATIVE
Neisseria Gonorrhea: NEGATIVE

## 2023-04-09 LAB — SURGICAL PATHOLOGY

## 2023-04-09 LAB — GROUP B STREP BY PCR: Group B strep by PCR: POSITIVE — AB

## 2023-04-09 NOTE — Progress Notes (Signed)
CSW received and acknowledges consult for EDPS of 14.  Consult screened out as CSW Nelida Meuse) completed a full psychosocial assessment with MOB on 04/08/2023 and provided provided education regarding the baby blues period vs. perinatal mood disorders, discussed treatment and gave resources for mental health follow up if concerns arise.  CSW recommends self-evaluation during the postpartum time period using the New Mom Checklist from Postpartum Progress and encouraged MOB to contact a medical professional if symptoms are noted at any time.    CSW identifies no further need for intervention and no barriers to discharge at this time.  Celso Sickle, LCSW Clinical Social Worker [facility] Cell#: [phone]

## 2023-04-09 NOTE — Lactation Note (Signed)
This note was copied from a baby's chart. Lactation Consultation Note  Patient Name: Courtney [name] Peed Today's Date: 04/09/2023 Age:36 hours  LC checked with OBSC and mom received a Stork Pump today prior to D/C.         [name] 04/09/2023, 2:42 PM

## 2023-04-09 NOTE — Plan of Care (Signed)
Problem: Education: Goal: Knowledge of Childbirth will improve Outcome: Adequate for Discharge Goal: Ability to make informed decisions regarding treatment and plan of care will improve Outcome: Adequate for Discharge Goal: Ability to state and carry out methods to decrease the pain will improve Outcome: Adequate for Discharge Goal: Individualized Educational Video(s) Outcome: Adequate for Discharge   Problem: Coping: Goal: Ability to verbalize concerns and feelings about labor and delivery will improve Outcome: Adequate for Discharge   Problem: Life Cycle: Goal: Ability to make normal progression through stages of labor will improve Outcome: Adequate for Discharge Goal: Ability to effectively push during vaginal delivery will improve Outcome: Adequate for Discharge   Problem: Role Relationship: Goal: Will demonstrate positive interactions with the child Outcome: Adequate for Discharge   Problem: Safety: Goal: Risk of complications during labor and delivery will decrease Outcome: Adequate for Discharge   Problem: Pain Management: Goal: Relief or control of pain from uterine contractions will improve Outcome: Adequate for Discharge   Problem: Education: Goal: Knowledge of disease or condition will improve Outcome: Adequate for Discharge Goal: Knowledge of the prescribed therapeutic regimen will improve Outcome: Adequate for Discharge Goal: Individualized Educational Video(s) Outcome: Adequate for Discharge   Problem: Clinical Measurements: Goal: Complications related to the disease process, condition or treatment will be avoided or minimized Outcome: Adequate for Discharge   Problem: Education: Goal: Knowledge of General Education information will improve Description: Including pain rating scale, medication(s)/side effects and non-pharmacologic comfort measures Outcome: Adequate for Discharge   Problem: Health Behavior/Discharge Planning: Goal: Ability to manage  health-related needs will improve Outcome: Adequate for Discharge   Problem: Clinical Measurements: Goal: Ability to maintain clinical measurements within normal limits will improve Outcome: Adequate for Discharge Goal: Will remain free from infection Outcome: Adequate for Discharge Goal: Diagnostic test results will improve Outcome: Adequate for Discharge Goal: Respiratory complications will improve Outcome: Adequate for Discharge Goal: Cardiovascular complication will be avoided Outcome: Adequate for Discharge   Problem: Activity: Goal: Risk for activity intolerance will decrease Outcome: Adequate for Discharge   Problem: Nutrition: Goal: Adequate nutrition will be maintained Outcome: Adequate for Discharge   Problem: Coping: Goal: Level of anxiety will decrease Outcome: Adequate for Discharge   Problem: Elimination: Goal: Will not experience complications related to bowel motility Outcome: Adequate for Discharge Goal: Will not experience complications related to urinary retention Outcome: Adequate for Discharge   Problem: Pain Management: Goal: General experience of comfort will improve Outcome: Adequate for Discharge   Problem: Safety: Goal: Ability to remain free from injury will improve Outcome: Adequate for Discharge   Problem: Skin Integrity: Goal: Risk for impaired skin integrity will decrease Outcome: Adequate for Discharge   Problem: Education: Goal: Knowledge of condition will improve Outcome: Adequate for Discharge Goal: Individualized Educational Video(s) Outcome: Adequate for Discharge Goal: Individualized Newborn Educational Video(s) Outcome: Adequate for Discharge   Problem: Activity: Goal: Will verbalize the importance of balancing activity with adequate rest periods Outcome: Adequate for Discharge Goal: Ability to tolerate increased activity will improve Outcome: Adequate for Discharge   Problem: Coping: Goal: Ability to identify and  utilize available resources and services will improve Outcome: Adequate for Discharge   Problem: Life Cycle: Goal: Chance of risk for complications during the postpartum period will decrease Outcome: Adequate for Discharge   Problem: Role Relationship: Goal: Ability to demonstrate positive interaction with newborn will improve Outcome: Adequate for Discharge   Problem: Skin Integrity: Goal: Demonstration of wound healing without infection will improve Outcome: Adequate for Discharge

## 2023-04-10 DIAGNOSIS — Z4682 Encounter for fitting and adjustment of non-vascular catheter: Secondary | ICD-10-CM | POA: Diagnosis not present

## 2023-04-10 DIAGNOSIS — Z452 Encounter for adjustment and management of vascular access device: Secondary | ICD-10-CM | POA: Diagnosis not present

## 2023-04-10 LAB — URINE CULTURE: Culture: 80000 — AB

## 2023-04-11 DIAGNOSIS — Z4682 Encounter for fitting and adjustment of non-vascular catheter: Secondary | ICD-10-CM | POA: Diagnosis not present

## 2023-04-12 DIAGNOSIS — R140 Abdominal distension (gaseous): Secondary | ICD-10-CM | POA: Diagnosis not present

## 2023-04-12 DIAGNOSIS — Z4682 Encounter for fitting and adjustment of non-vascular catheter: Secondary | ICD-10-CM | POA: Diagnosis not present

## 2023-04-13 DIAGNOSIS — Z4682 Encounter for fitting and adjustment of non-vascular catheter: Secondary | ICD-10-CM | POA: Diagnosis not present

## 2023-04-13 DIAGNOSIS — Z452 Encounter for adjustment and management of vascular access device: Secondary | ICD-10-CM | POA: Diagnosis not present

## 2023-04-22 ENCOUNTER — Telehealth (HOSPITAL_COMMUNITY): Payer: Self-pay | Admitting: *Deleted

## 2023-04-22 NOTE — Telephone Encounter (Signed)
04/22/2023  Name: Shanera Santoso MRN: [id number] DOB: 1985-06-20  Reason for Call:  Transition of Care Hospital Discharge Call  Contact Status: Patient Contact Status: Message  Language assistant needed: Interpreter Mode: Interpreter Not Needed        Follow-Up Questions:    Inocente Salles Postnatal Depression Scale:  In the Past 7 Days:    PHQ2-9 Depression Scale:     Discharge Follow-up:    Post-discharge interventions: NA  Salena Saner, RN 04/22/2023  09:32

## 2023-04-25 ENCOUNTER — Ambulatory Visit (HOSPITAL_COMMUNITY): Payer: Self-pay

## 2023-04-25 NOTE — Lactation Note (Signed)
This note was copied from a baby's chart.  NICU Lactation Consultation Note  Patient Name: Courtney [name] [id number] Date: 04/25/2023 Age:36 wk.o.  Reason for consult: Follow-up assessment; NICU baby; Preterm <34wks; Infant < 6lbs  SUBJECTIVE  LC in to visit with P2 Mom of "Courtney [name]" who is [redacted]w[redacted]d PMA.  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  Baby is NPO and had his first stool today.  RN doing colostrum drops oral swabbing with family.  LC provided Mom with another pumping band.  Mom is concerned she isn't pumping enough at 7 times per 24 hrs.  Reassured her that she was doing great.  Her supply is stable, low normal.  Reviewed ways to increase her milk supply and provided Mom with a handout on increasing milk supply with a NICU baby (Lactation education resources).  Mom inquired about drinking a small cup of coffee as she has been dumping 2 pumpings after she drinks it.  Reassured her that eating a well balanced diet and everything in moderation, one cup, not 6 cups was perfectly ok.. Mom states she feels overtired.  Encouraged her to "cluster pump" in the evening and then sleep 4-6 hrs at night.    Moringa herbal supplement handout provided as well.    Mom aware of lactation support and encouraged to ask for LC prn.  OBJECTIVE Infant data: Mother's Current Feeding Choice: Breast Milk and Donor Milk  O2 Device: Jet Vent FiO2 (%): 28 %  Infant feeding assessment No data recorded  Maternal data: [id number] Vaginal, Spontaneous Pumping frequency: 7 times per 24 hrs Pumped volume: 60 mL (60-90 ml) Flange Size: 18 Hands-free pumping top sizes: Small/Medium (Blue)  Pump: Stork Pump  Feeding Status: NPO  Maternal: Milk volume: Normal (low normal)  INTERVENTIONS/PLAN Interventions: Interventions: Breast feeding basics reviewed; Breast massage; Hand express; Skin to skin; DEBP Tools: Pump; Flanges; Hands-free pumping top Pump Education: Setup,  frequency, and cleaning; Milk Storage  Plan: Consult Status: NICU follow-up NICU Follow-up type: Weekly NICU follow up   Courtney [name] 04/25/2023, 6:25 PM

## 2023-04-27 ENCOUNTER — Telehealth (HOSPITAL_COMMUNITY): Payer: Self-pay | Admitting: *Deleted

## 2023-04-27 NOTE — Telephone Encounter (Signed)
04/27/2023  Name: Crissi Konja MRN: [id number] DOB: 1986/03/24  Reason for Call:  Transition of Care Hospital Discharge Call  Contact Status: Patient Contact Status: Complete  Patient phoned back after receiving voice mail last week.  Language assistant needed: Interpreter Mode: Interpreter Not Needed        Follow-Up Questions: Do You Have Any Concerns About Your Health As You Heal From Delivery?: No Do You Have Any Concerns About Your Infants Health?: Infant in NICU  Patient's baby was born at 25 weeks and will be in the NICU for a while.  Patient is wondering when she can return to work and save some of her maternity leave for when the baby goes home.  Advised that it is dependent on her rate of healing and the type of work that she does.  Encouraged her to call her OB for further guidance.    Edinburgh Postnatal Depression Scale:  In the Past 7 Days: I have been able to laugh and see the funny side of things.: As much as I always could I have looked forward with enjoyment to things.: Rather less than I used to I have blamed myself unnecessarily when things went wrong.: Not very often I have been anxious or worried for no good reason.: No, not at all I have felt scared or panicky for no good reason.: No, not at all Things have been getting on top of me.: No, most of the time I have coped quite well I have been so unhappy that I have had difficulty sleeping.: Not at all I have felt sad or miserable.: Not very often I have been so unhappy that I have been crying.: No, never The thought of harming myself has occurred to me.: Never Edinburgh Postnatal Depression Scale Total: 4  PHQ2-9 Depression Scale:     Discharge Follow-up: Edinburgh score requires follow up?: No Patient was advised of the following resources:: Support Group, Breastfeeding Support Group  Post-discharge interventions: NA  Salena Saner, RN 04/27/2023 16:10
